# Patient Record
Sex: Female | Born: 1990
Health system: Southern US, Community
[De-identification: ages and names within clinical notes are randomized; demographics above are authoritative.]

## PROBLEM LIST (undated history)

## (undated) ENCOUNTER — Inpatient Hospital Stay (HOSPITAL_COMMUNITY): Payer: Self-pay

## (undated) DIAGNOSIS — Z789 Other specified health status: Secondary | ICD-10-CM

## (undated) HISTORY — PX: NO PAST SURGERIES: SHX2092

---
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
## 2010-07-18 LAB — CBC
HCT: 33.5 % — ABNORMAL LOW (ref 36.0–46.0)
Hemoglobin: 11.3 g/dL — ABNORMAL LOW (ref 12.0–15.0)
Hemoglobin: 9.1 g/dL — ABNORMAL LOW (ref 12.0–15.0)
MCHC: 33.6 g/dL (ref 30.0–36.0)
MCHC: 34.9 g/dL (ref 30.0–36.0)
MCV: 83.9 fL (ref 78.0–100.0)
MCV: 83.9 fL (ref 78.0–100.0)
MCV: 84.6 fL (ref 78.0–100.0)
Platelets: 331 10*3/uL (ref 150–400)
RBC: 3.1 MIL/uL — ABNORMAL LOW (ref 3.87–5.11)
RBC: 3.96 MIL/uL (ref 3.87–5.11)
RDW: 14.2 % (ref 11.5–15.5)
RDW: 14.5 % (ref 11.5–15.5)
RDW: 14.8 % (ref 11.5–15.5)

## 2010-07-18 LAB — COMPREHENSIVE METABOLIC PANEL
AST: 20 U/L (ref 0–37)
Albumin: 2.9 g/dL — ABNORMAL LOW (ref 3.5–5.2)
Alkaline Phosphatase: 147 U/L — ABNORMAL HIGH (ref 39–117)
Calcium: 8.9 mg/dL (ref 8.4–10.5)
Chloride: 107 mEq/L (ref 96–112)
GFR calc Af Amer: >60 mL/min (ref >60)
Potassium: 3.5 mEq/L (ref 3.5–5.1)
Sodium: 136 mEq/L (ref 135–145)

## 2010-07-18 LAB — URINALYSIS, ROUTINE W REFLEX MICROSCOPIC
Bilirubin Urine: NEGATIVE
Hgb urine dipstick: NEGATIVE
Ketones, ur: NEGATIVE mg/dL

## 2010-07-18 LAB — URIC ACID: Uric Acid, Serum: 5 mg/dL (ref 2.4–7.0)

## 2010-07-18 LAB — RPR: RPR Ser Ql: NONREACTIVE

## 2010-08-01 LAB — CBC
HCT: 35.7 % — ABNORMAL LOW (ref 36.0–46.0)
Hemoglobin: 12.3 g/dL (ref 12.0–15.0)
MCHC: 34.3 g/dL (ref 30.0–36.0)
Platelets: 285 10*3/uL (ref 150–400)
RBC: 3.8 MIL/uL — ABNORMAL LOW (ref 3.87–5.11)
RDW: 12.8 % (ref 11.5–15.5)
WBC: 10.9 10*3/uL — ABNORMAL HIGH (ref 4.0–10.5)

## 2010-08-01 LAB — WET PREP, GENITAL: Trich, Wet Prep: NONE SEEN

## 2010-08-01 LAB — GC/CHLAMYDIA PROBE AMP, GENITAL: Chlamydia, DNA Probe: NEGATIVE

## 2010-08-01 LAB — URINALYSIS, ROUTINE W REFLEX MICROSCOPIC
Bilirubin Urine: NEGATIVE
Protein, ur: NEGATIVE mg/dL

## 2010-08-02 LAB — WET PREP, GENITAL
Clue Cells Wet Prep HPF POC: NONE SEEN
Yeast Wet Prep HPF POC: NONE SEEN

## 2011-06-30 IMAGING — US US OB COMP +14 WK
1 series · 14 of 28 positions shown · non-contrast
Comparison: none

OBSTETRICAL ULTRASOUND:
 This ultrasound exam was performed in the [HOSPITAL] Ultrasound Department.  The OB US report was generated in the AS system, and faxed to the ordering physician.  This report is also available in [HOSPITAL]?s AccessANYware and in [REDACTED] PACS.

[Series 1: us ob comp +14 wk · 0.20mm/px · 14 of 38 slices shown]
[im 2/38]
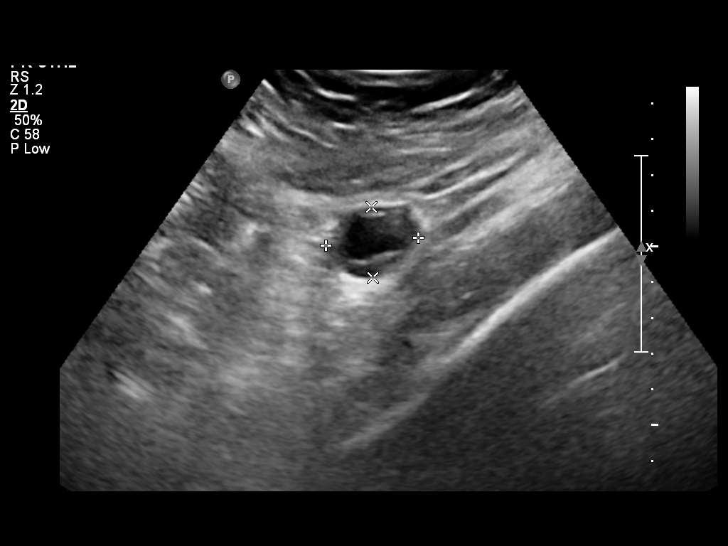
[im 5/38]
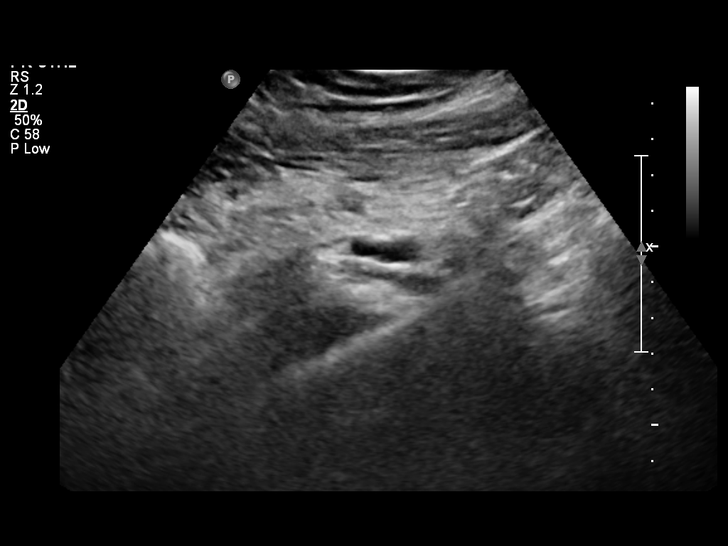
[im 7/38]
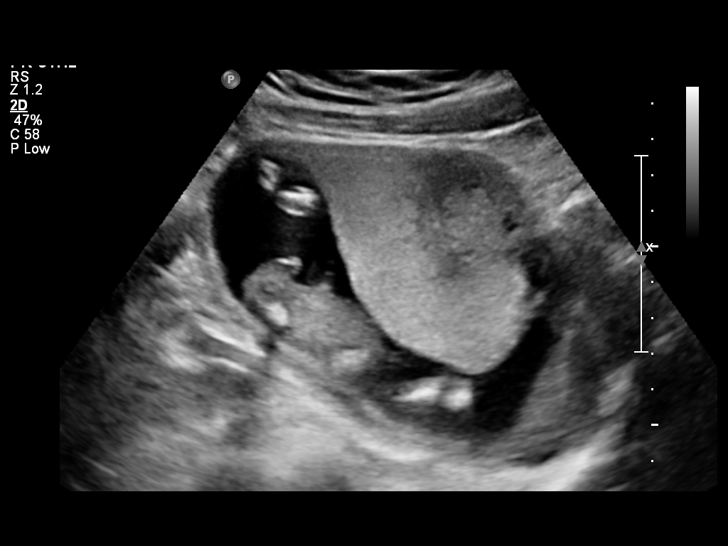
[im 10/38]
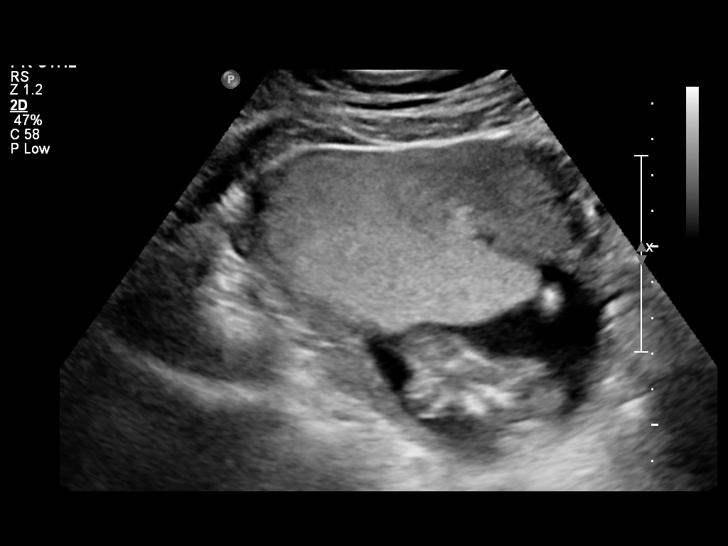
[im 13/38]
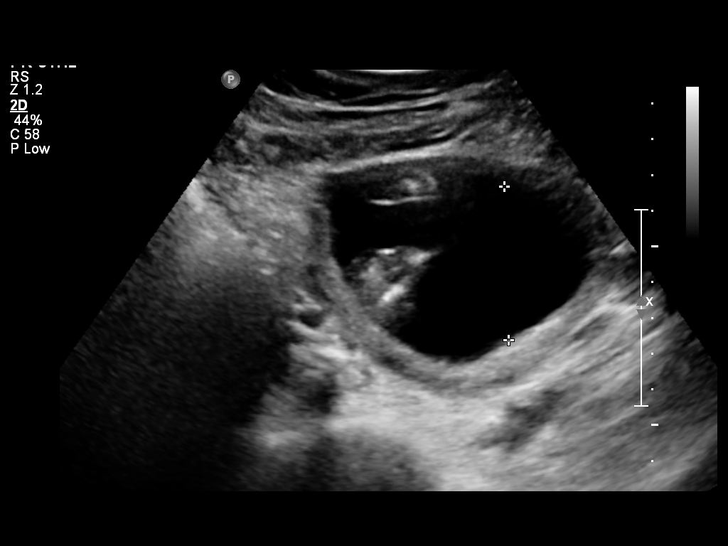
[im 16/38]
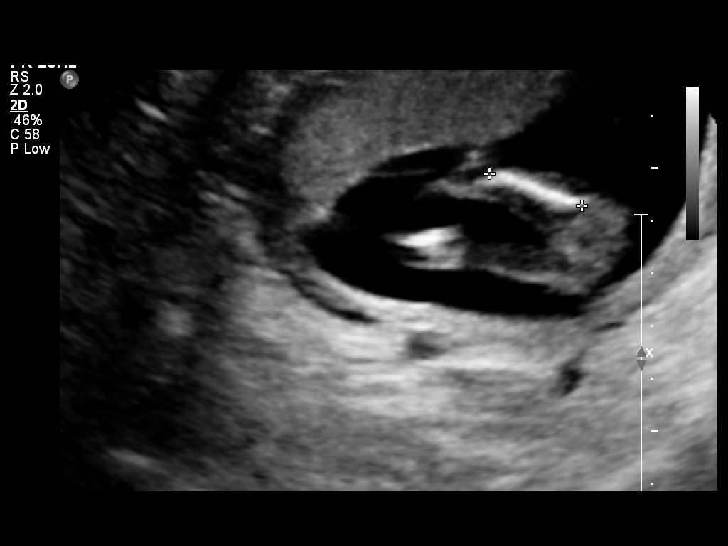
[im 18/38]
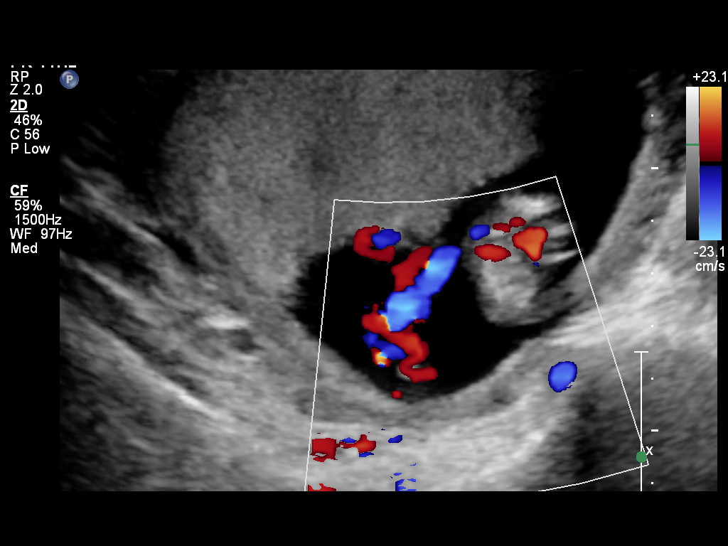
[im 21/38]
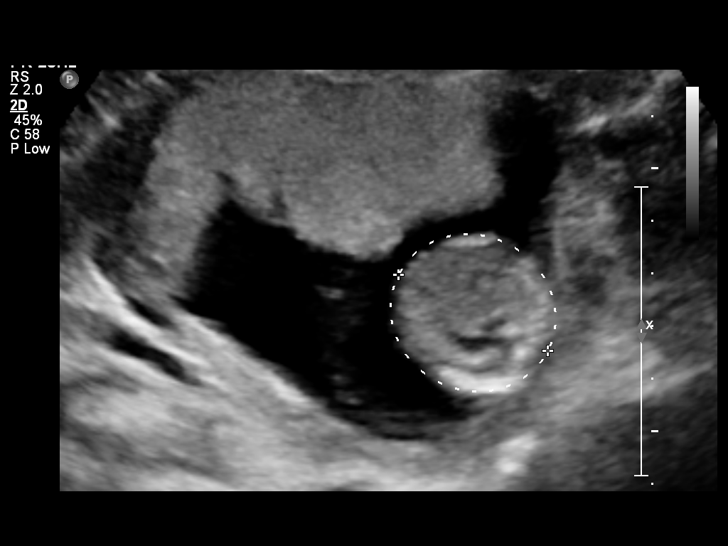
[im 24/38]
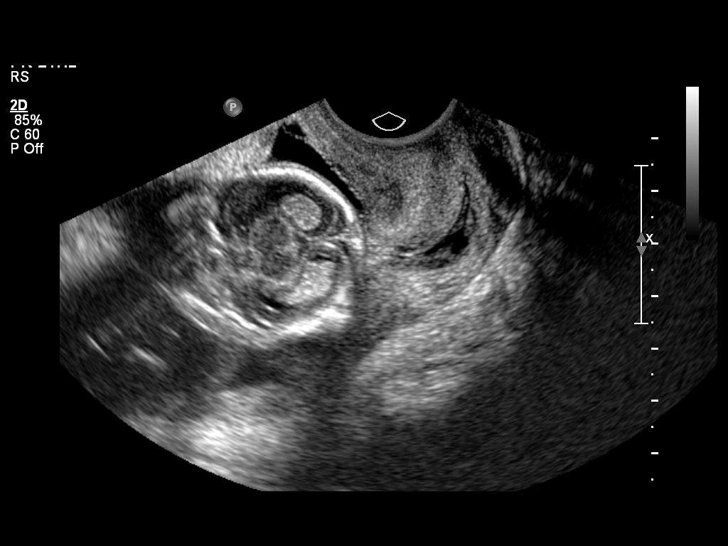
[im 27/38]
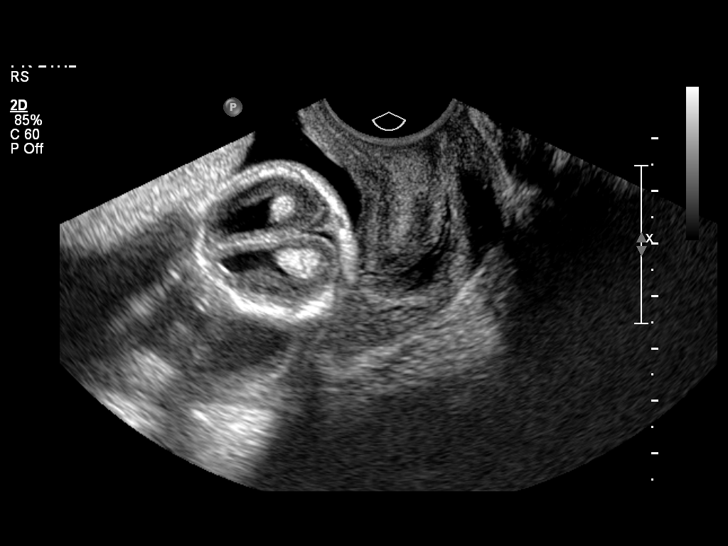
[im 29/38]
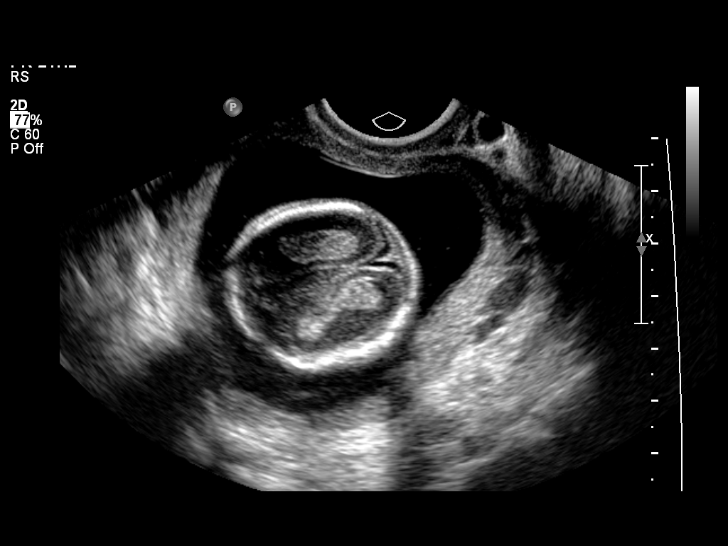
[im 32/38]
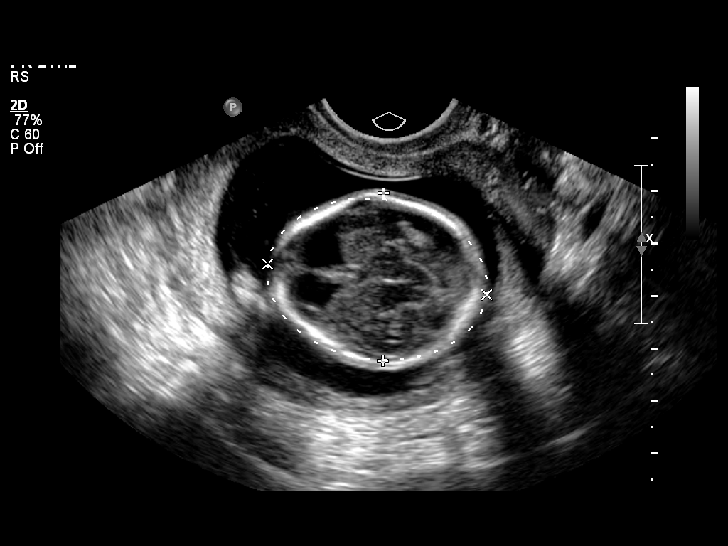
[im 35/38]
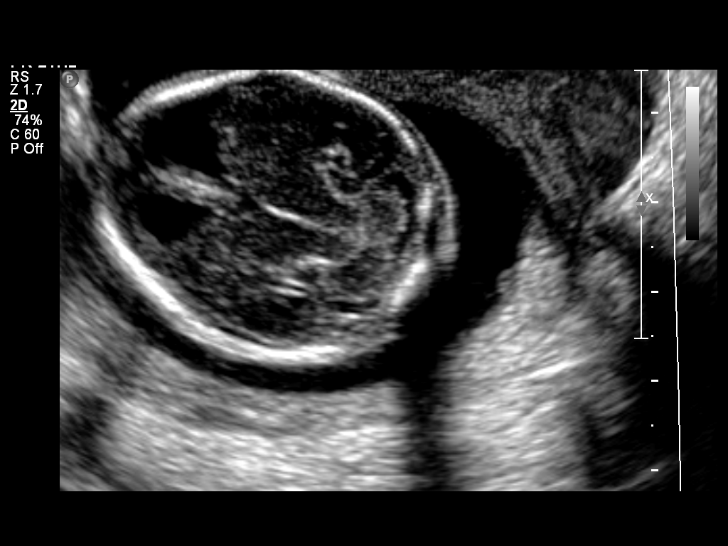
[im 38/38]
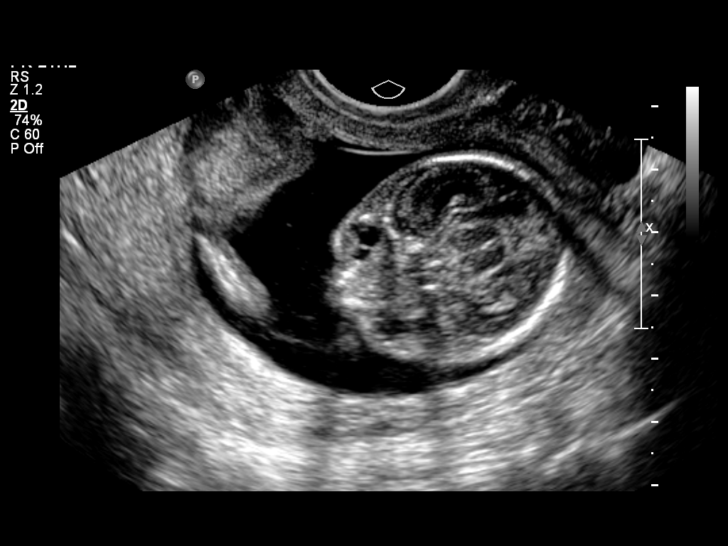

[14 of 28 positions shown; findings below may reference images not displayed]

IMPRESSION: See AS Obstetric US report.

## 2011-07-22 IMAGING — US US OB DETAIL+14 WK
1 series · 14 of 28 positions shown · non-contrast
Comparison: none

OBSTETRICAL ULTRASOUND:
 This ultrasound exam was performed in the [HOSPITAL] Ultrasound Department.  The OB US report was generated in the AS system, and faxed to the ordering physician.  This report is also available in [HOSPITAL]?s Access[employer]ware and in [REDACTED] PACS.

[Series 1: us ob detail +14 wk · 0.19mm/px · 85 acquisitions, 14 frames shown]
[im 4/85]
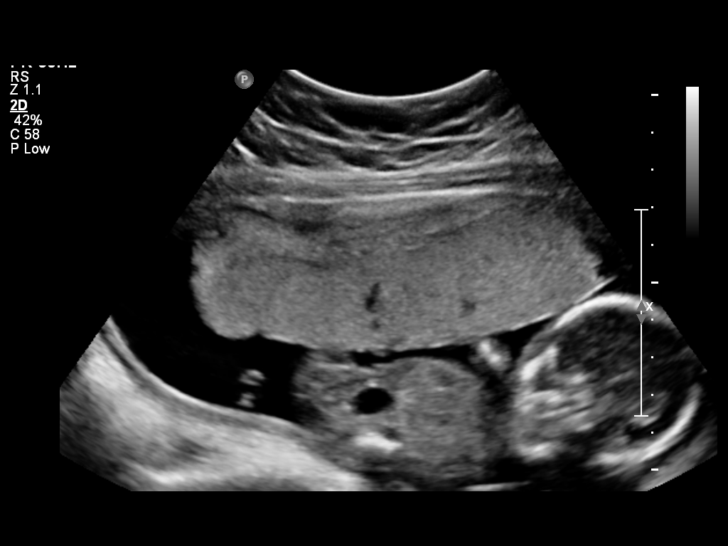
[im 10/85]
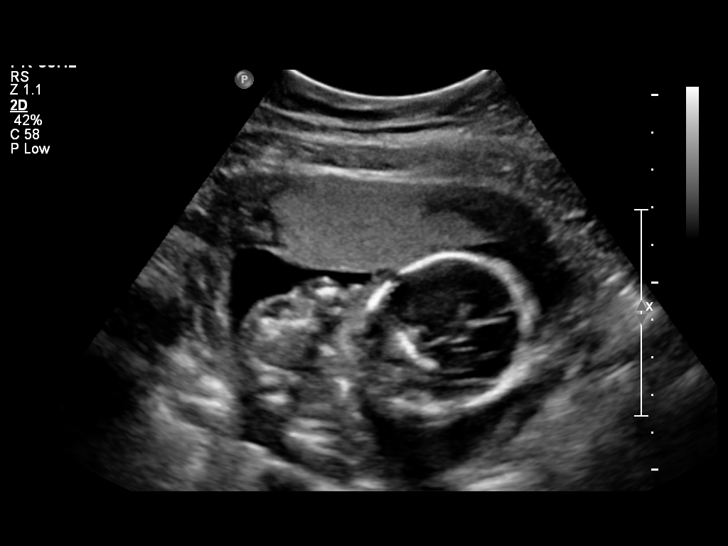
[im 16/85]
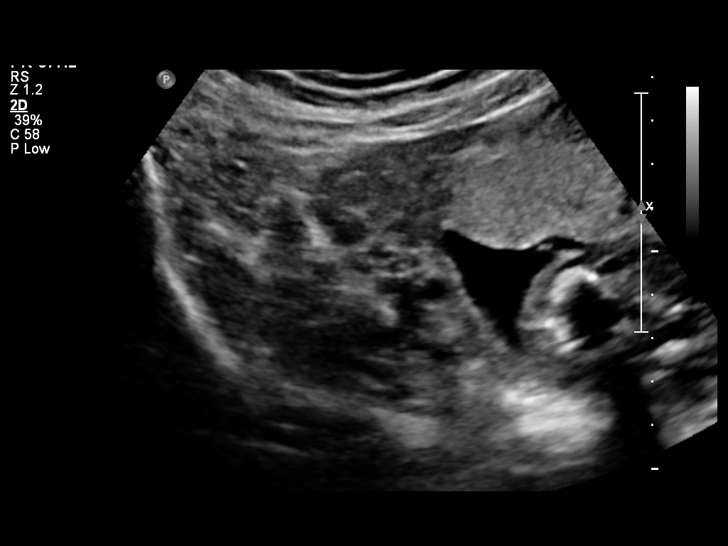
[im 22/85]
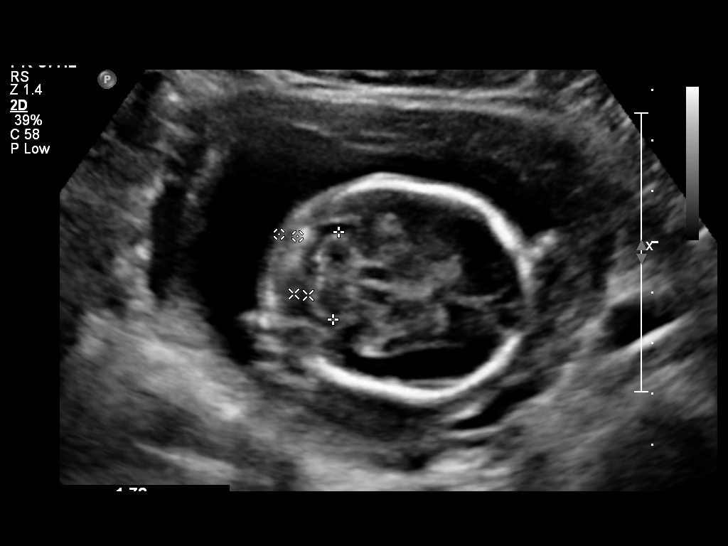
[im 29/85]
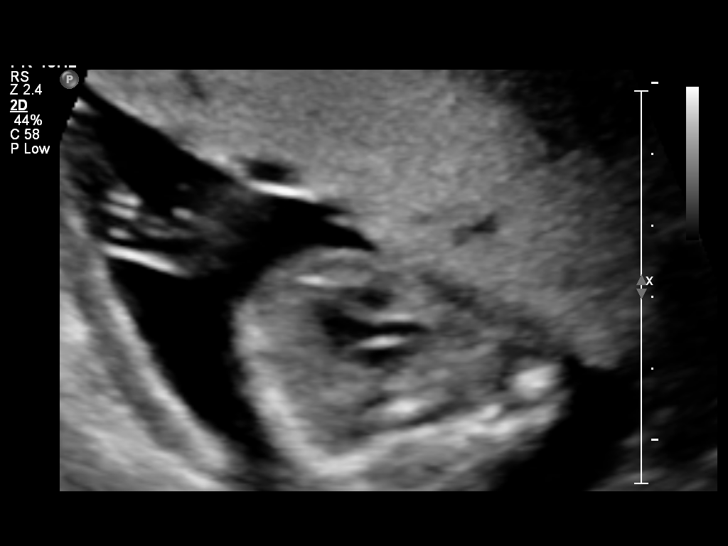
[im 35/85]
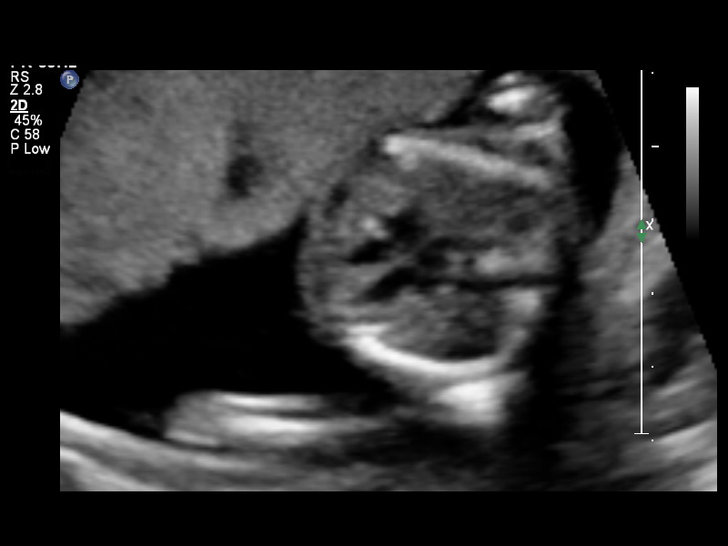
[im 41/85]
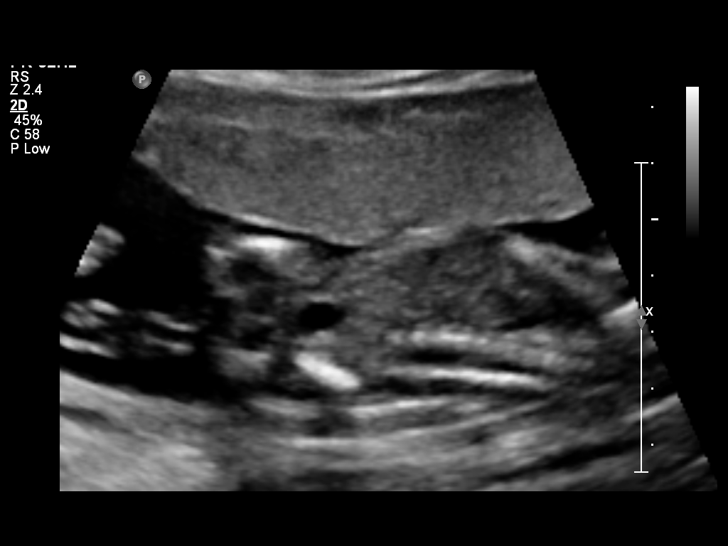
[im 47/85]
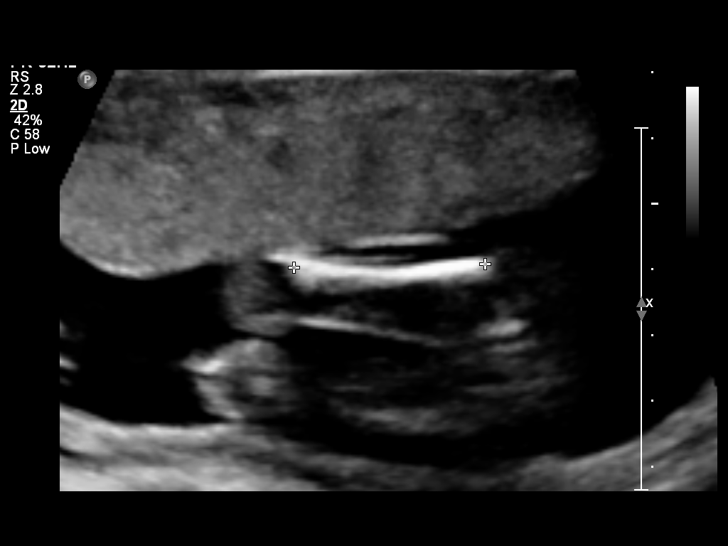
[im 53/85]
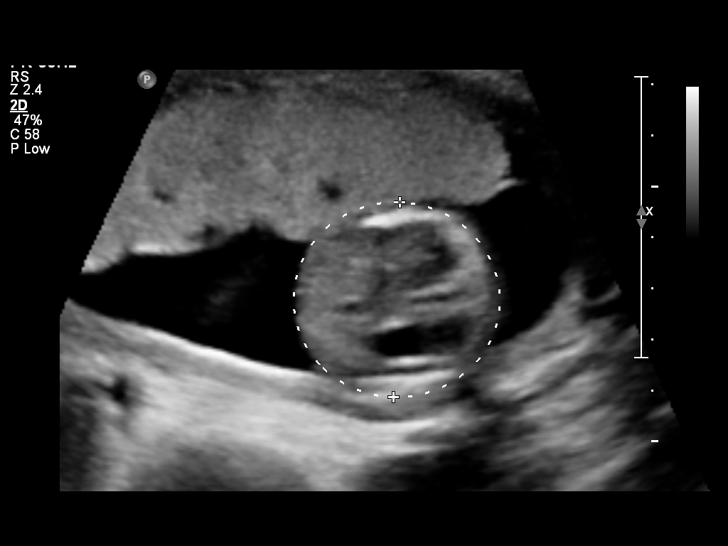
[im 60/85]
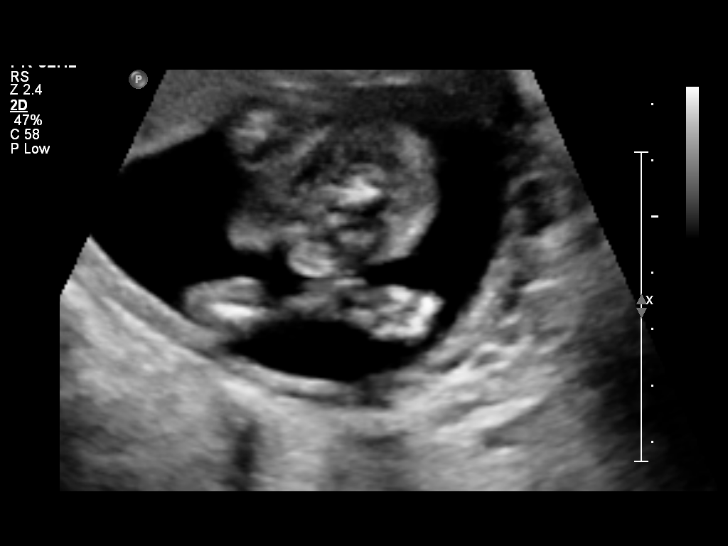
[im 66/85]
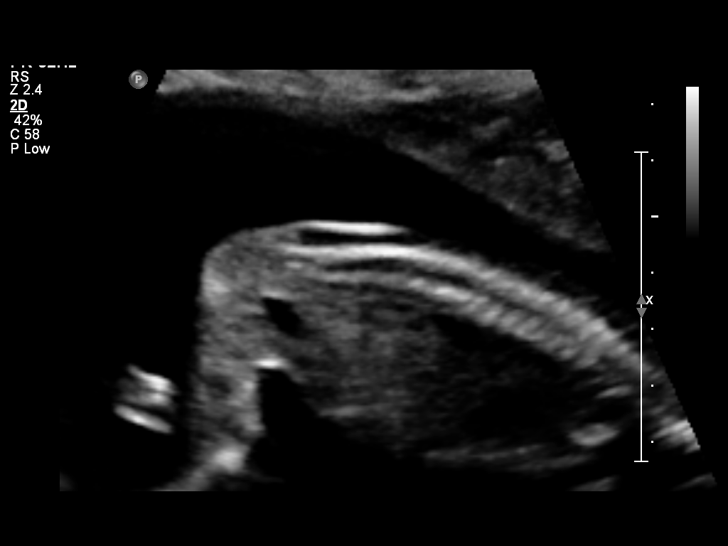
[im 72/85]
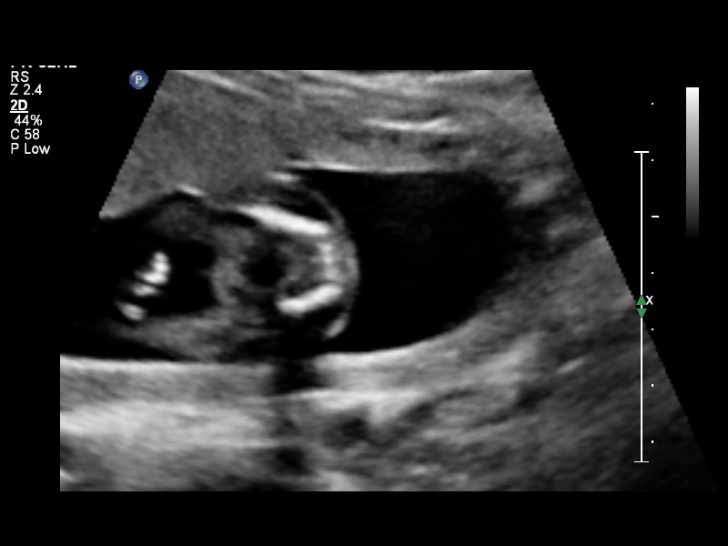
[im 78/85]
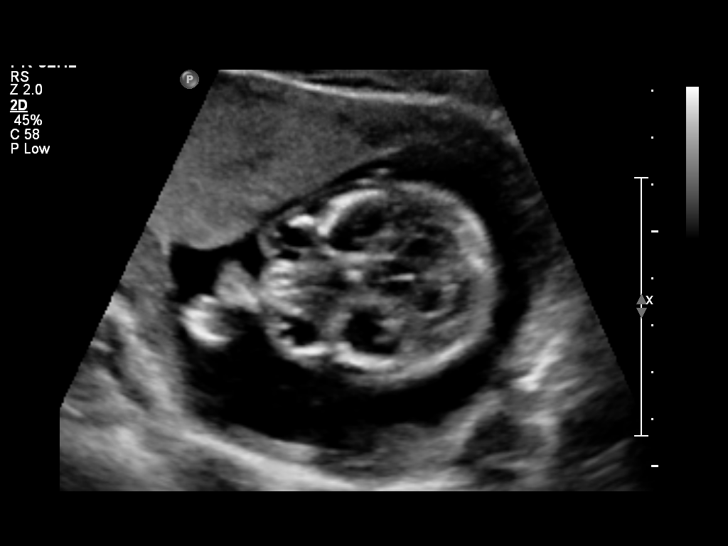
[im 85/85]
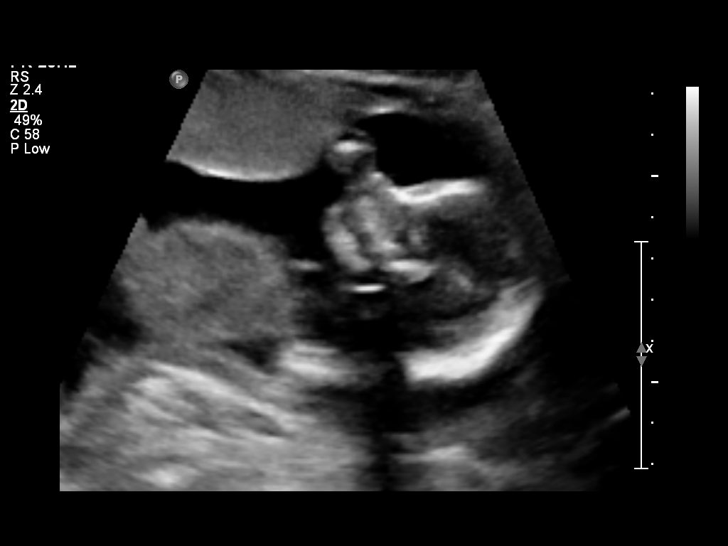

[14 of 28 positions shown; findings below may reference images not displayed]

IMPRESSION: See AS Obstetric US report.

## 2015-05-01 NOTE — L&D Delivery Note (Signed)
25 y.o. G2P1001 at 3284w1d delivered a viable female infant in cephalic, LOA position. No nuchal cord. Right anterior shoulder delivered with ease. 60 sec delayed cord clamping. Cord clamped x2 and cut. Placenta delivered spontaneously intact, with 3VC. Fundus firm on exam with massage and pitocin. Good hemostasis noted.  Laceration: 2nd degree perineal Suture: 3-0 Vicryl Good hemostasis noted.  Mom and baby recovering in LDR.    Apgars:  7/9 Weight:  pending    Jen MowElizabeth Quaid Yeakle, DO OB Fellow [facility] 01/07/2016, 3:16 AM

## 2015-05-01 NOTE — L&D Delivery Note (Signed)
Delivery Note At 2:21 AM a viable and healthy female was delivered via vaginal Delivery (presentation: cephalic LOA ; no nuchal cord  ).  APGAR: 7, 9; weight 7 lb 0.7 oz (3195 g).   Placenta status: intact.  Cord: 3VC with the following complications: 2nd degree perineal laceration.  Cord pH: pending Fundus firm with massage and pitocin  Anesthesia: none Episiotomy: None Lacerations: 2nd degree; Perineal.  Hemostasis noted Suture Repair: 3.0 vicryl Est. Blood Loss (mL): 200  Mom to recovering here in LDR.  Baby to Couplet care / Skin to Skin.  Richard Godine 01/07/2016, 4:34 AM   OB FELLOW MEDICAL STUDENT NOTE ATTESTATION  I have seen and examined this patient. Note this is a Psychologist, occupationalmedical student note and as such does not necessarily reflect the patient's plan of care. Please see delivery note for this date of service.    Jen MowElizabeth Denard Tuminello, DO OB Fellow 01/09/2016, 7:15 PM

## 2015-05-27 ENCOUNTER — Other Ambulatory Visit (INDEPENDENT_AMBULATORY_CARE_PROVIDER_SITE_OTHER): Payer: Self-pay

## 2015-05-27 DIAGNOSIS — Z3481 Encounter for supervision of other normal pregnancy, first trimester: Secondary | ICD-10-CM

## 2015-05-28 LAB — SICKLE CELL SCREEN: Sickle Cell Screen: NEGATIVE

## 2015-05-28 LAB — HIV ANTIBODY (ROUTINE TESTING W REFLEX): HIV 1&2 Ab, 4th Generation: NONREACTIVE

## 2015-05-30 LAB — OBSTETRIC PANEL
Antibody Screen: NEGATIVE
Basophils Absolute: 0 10*3/uL (ref 0.0–0.1)
Basophils Relative: 0 % (ref 0–1)
Eosinophils Absolute: 0.1 10*3/uL (ref 0.0–0.7)
Eosinophils Relative: 1 % (ref 0–5)
HCT: 39.1 % (ref 36.0–46.0)
HEP B S AG: NEGATIVE
Hemoglobin: 13.4 g/dL (ref 12.0–15.0)
LYMPHS ABS: 2.1 10*3/uL (ref 0.7–4.0)
LYMPHS PCT: 26 % (ref 12–46)
MCH: 30.6 pg (ref 26.0–34.0)
MCHC: 34.3 g/dL (ref 30.0–36.0)
MCV: 89.3 fL (ref 78.0–100.0)
MONO ABS: 0.6 10*3/uL (ref 0.1–1.0)
MONOS PCT: 7 % (ref 3–12)
MPV: 9.2 fL (ref 8.6–12.4)
NEUTROS ABS: 5.4 10*3/uL (ref 1.7–7.7)
Neutrophils Relative %: 66 % (ref 43–77)
PLATELETS: 304 10*3/uL (ref 150–400)
RBC: 4.38 MIL/uL (ref 3.87–5.11)
RDW: 13 % (ref 11.5–15.5)
RH TYPE: POSITIVE
Rubella: 27.5 Index — ABNORMAL HIGH (ref <0.90)
WBC: 8.2 10*3/uL (ref 4.0–10.5)

## 2015-06-03 ENCOUNTER — Other Ambulatory Visit (HOSPITAL_COMMUNITY): Admission: RE | Admit: 2015-06-03 | Payer: Self-pay | Source: Ambulatory Visit | Admitting: Family Medicine

## 2015-06-03 ENCOUNTER — Encounter: Payer: Self-pay | Admitting: Internal Medicine

## 2015-06-03 ENCOUNTER — Ambulatory Visit (INDEPENDENT_AMBULATORY_CARE_PROVIDER_SITE_OTHER): Payer: Self-pay | Admitting: Internal Medicine

## 2015-06-03 VITALS — BP 126/80 | HR 69 | Temp 99.0°F | Ht 62.0 in | Wt 179.8 lb

## 2015-06-03 DIAGNOSIS — Z3481 Encounter for supervision of other normal pregnancy, first trimester: Secondary | ICD-10-CM

## 2015-06-03 LAB — POCT WET PREP (WET MOUNT): Clue Cells Wet Prep Whiff POC: NEGATIVE

## 2015-06-03 NOTE — Patient Instructions (Addendum)
Please return in the next 4 weeks for your next prenatal visit. Return on Monday for a lab appointment. A healthy weight gain for you during this pregnancy would be 11-20 lbs total. You should be gaining about 0.5 lb a week starting when you are [redacted] weeks pregnant.    Safe Medications in Pregnancy   Acne: Benzoyl Peroxide Salicylic Acid  Backache/Headache: Tylenol: 2 regular strength every 4 hours OR              2 Extra strength every 6 hours  Colds/Coughs/Allergies: Benadryl (alcohol free) 25 mg every 6 hours as needed Breath right strips Claritin Cepacol throat lozenges Chloraseptic throat spray Cold-Eeze- up to three times per day Cough drops, alcohol free Flonase (by prescription only) Guaifenesin Mucinex Robitussin DM (plain only, alcohol free) Saline nasal spray/drops Sudafed (pseudoephedrine) & Actifed ** use only after [redacted] weeks gestation and if you do not have high blood pressure Tylenol Vicks Vaporub Zinc lozenges Zyrtec   Constipation: Colace Ducolax suppositories Fleet enema Glycerin suppositories Metamucil Milk of magnesia Miralax Senokot Smooth move tea  Diarrhea: Kaopectate Imodium A-D  *NO pepto Bismol  Hemorrhoids: Anusol Anusol HC Preparation H Tucks  Indigestion: Tums Maalox Mylanta Zantac  Pepcid  Insomnia: Benadryl (alcohol free)  every 6 hours as needed Tylenol PM Unisom, no Gelcaps  Leg Cramps: Tums MagGel  Nausea/Vomiting:  Bonine Dramamine Emetrol Ginger extract Sea bands Meclizine  Nausea medication to take during pregnancy:  Unisom (doxylamine succinate 25 mg tablets) Take one tablet daily at bedtime. If symptoms are not adequately controlled, the dose can be increased to a maximum recommended dose of two tablets daily (1/2 tablet in the morning, 1/2 tablet mid-afternoon and one at bedtime). Vitamin B6  tablets. Take one tablet twice a day (up to 200 mg per day).  Skin Rashes: Aveeno  products Benadryl cream or  every 6 hours as needed Calamine Lotion 1% cortisone cream  Yeast infection: Gyne-lotrimin 7 Monistat 7   **If taking multiple medications, please check labels to avoid duplicating the same active ingredients **take medication as directed on the label ** Do not exceed 4000 mg of tylenol in 24 hours **Do not take medications that contain aspirin or ibuprofen

## 2015-06-03 NOTE — Progress Notes (Signed)
Anna Green is a 26 y.o. yo G2P1001 at [redacted]w[redacted]d who presents for her initial prenatal visit. Pregnancy is planned She reports cramping daily for a couple weeks. Feels like menstrual cramps. Denies vaginal bleeding or discharge. . She  is taking PNV. See flow sheet for details.  PMH, POBH, FH, meds, allergies and Social Hx reviewed.  Prenatal Exam: Gen: Well nourished, well developed.  No distress.  Vitals noted. HEENT: Normocephalic, atraumatic.  Neck supple without cervical lymphadenopathy, thyromegaly or thyroid nodules.  Fair dentition. CV: RRR no murmur, gallops or rubs Lungs: CTAB.  Normal respiratory effort without wheezes or rales. Abd: soft, NTND. +BS.  Uterus not appreciated above pelvis. GU: Normal external female genitalia without lesions.  Normal vaginal, well rugated without lesions. No vaginal discharge.  Bimanual exam: No adnexal mass or TTP. No CMT.  Uterus size consistent with dates. Ext: No clubbing, cyanosis or edema. Psych: Normal grooming and dress.  Not depressed or anxious appearing.  Normal thought content and process without flight of ideas or looseness of associations.  Assessment & Plan: 1) 25 y.o. yo G2P1001 at [redacted]w[redacted]d via LMP doing well.  Current pregnancy issues include cramping. Will order serum bHCG today and repeat on Monday to ensure levels not multiplying in a fashion consistent with an ectopic pregnancy.  Dating is reliable. Prenatal labs reviewed, unremarkable. Will obtain OB urine culture as this was not performed with initial OB labs.  Genetic screening discussed. Patient to consider for future visits.  Early glucola is indicated given patient's Hispanic ethnicity and BMI.  PHQ-9 and Pregnancy Medical Home forms completed and reviewed. Pap smear, GC/chlamydia, and wet prep performed today.   Bleeding and pain precautions reviewed. Importance of prenatal vitamins reviewed.  Follow up in 4 weeks.

## 2015-06-04 LAB — CULTURE, OB URINE

## 2015-06-04 LAB — HCG, QUANTITATIVE, PREGNANCY: HCG, BETA CHAIN, QUANT, S: 66631.2 m[IU]/mL — AB

## 2015-06-06 ENCOUNTER — Other Ambulatory Visit: Payer: Self-pay

## 2015-06-06 LAB — CERVICOVAGINAL ANCILLARY ONLY
Chlamydia: NEGATIVE
Neisseria Gonorrhea: NEGATIVE

## 2015-06-06 LAB — CYTOLOGY - PAP

## 2015-06-08 ENCOUNTER — Encounter: Payer: Self-pay | Admitting: Internal Medicine

## 2015-06-30 ENCOUNTER — Ambulatory Visit (INDEPENDENT_AMBULATORY_CARE_PROVIDER_SITE_OTHER): Payer: Self-pay | Admitting: Internal Medicine

## 2015-06-30 VITALS — BP 129/76 | HR 83 | Temp 98.7°F | Wt 184.0 lb

## 2015-06-30 DIAGNOSIS — Z331 Pregnant state, incidental: Secondary | ICD-10-CM

## 2015-06-30 DIAGNOSIS — Z3491 Encounter for supervision of normal pregnancy, unspecified, first trimester: Secondary | ICD-10-CM

## 2015-06-30 NOTE — Patient Instructions (Signed)
Make a lab appointment before 2 weeks for your glucose tolerance test (testing for diabetes). Make an appointment with Dr. Earlene Plater for your next prenatal visit in 4 weeks.

## 2015-06-30 NOTE — Progress Notes (Signed)
Anna Green is a 25 y.o. G2P1001 at [redacted]w[redacted]d here for routine follow up.  She reports no complaints. See flow sheet for details.  A/P: Pregnancy at [redacted]w[redacted]d.  Doing well.   Pregnancy issues include: no concerns. Taking PNV daily.  Early GTT is indicated in this patient. She is unable to stay for testing today. Future lab order placed and patient to return in the next 2 weeks for testing.  Patient is not sure if she is  interested in genetic screening. Still have time to decide prior to time of QUAD screen.  Bleeding and pain precautions reviewed. Follow up 4 weeks.

## 2015-07-06 ENCOUNTER — Other Ambulatory Visit: Payer: Self-pay

## 2015-07-08 ENCOUNTER — Other Ambulatory Visit (INDEPENDENT_AMBULATORY_CARE_PROVIDER_SITE_OTHER): Payer: Self-pay

## 2015-07-08 DIAGNOSIS — Z3491 Encounter for supervision of normal pregnancy, unspecified, first trimester: Secondary | ICD-10-CM

## 2015-07-08 DIAGNOSIS — Z331 Pregnant state, incidental: Secondary | ICD-10-CM

## 2015-07-08 LAB — GLUCOSE, CAPILLARY
Comment 1: 1
GLUCOSE-CAPILLARY: 123 mg/dL — AB (ref 65–99)

## 2015-07-13 ENCOUNTER — Other Ambulatory Visit: Payer: Self-pay

## 2015-08-04 ENCOUNTER — Ambulatory Visit (INDEPENDENT_AMBULATORY_CARE_PROVIDER_SITE_OTHER): Payer: Self-pay | Admitting: Internal Medicine

## 2015-08-04 ENCOUNTER — Encounter: Payer: Self-pay | Admitting: Internal Medicine

## 2015-08-04 VITALS — BP 134/85 | HR 88 | Temp 98.2°F | Wt 185.0 lb

## 2015-08-04 DIAGNOSIS — Z3490 Encounter for supervision of normal pregnancy, unspecified, unspecified trimester: Secondary | ICD-10-CM

## 2015-08-04 DIAGNOSIS — Z331 Pregnant state, incidental: Secondary | ICD-10-CM

## 2015-08-04 LAB — AFP, QUAD SCREEN
AFP MOM: 0.85
DIA MOM: 0.68
HCG MOM: 0.55
UE3 MOM: 1.37

## 2015-08-04 NOTE — Patient Instructions (Signed)
Please go to your scheduled ultrasound. I will let you know the results of the genetic screening.   Return in 4 weeks.   Keep taking your prenatal vitamins!

## 2015-08-04 NOTE — Progress Notes (Signed)
Recheck BP 110/72. Deseree Bruna PotterBlount, CMA

## 2015-08-04 NOTE — Progress Notes (Signed)
Anna Green is a 25 y.o. G2P1001 at 3210w6d here for routine follow up.  She reports no complaints. See flow sheet for details. Repeat BP 110/72.   A/P: Pregnancy at 8210w6d.  Doing well.   Pregnancy issues include None. Anatomy ultrasound ordered to be scheduled at 18-19 weeks. Patient is interested in genetic screening. AFP Quad screen ordered today.  Bleeding and pain precautions reviewed. Will continue to monitor BP closely.  Follow up 4 weeks.

## 2015-08-17 ENCOUNTER — Telehealth: Payer: Self-pay | Admitting: *Deleted

## 2015-08-17 NOTE — Telephone Encounter (Signed)
-----   Message from Arvilla Marketatherine Lauren Wallace, DO sent at 08/17/2015  3:29 PM EDT ----- Please call patient and let her know that genetic screening was essentially negative. Her baby has a 1 in 10,000 chance of having Down Syndrome or Trisomy 18 defect based on her AFP results.   Marcy Sirenatherine Wallace, D.O. 08/17/2015, 3:29 PM PGY-1, [facility]

## 2015-08-17 NOTE — Telephone Encounter (Signed)
Pt informed. Zimmerman Rumple, April D, CMA  

## 2015-08-17 NOTE — Telephone Encounter (Signed)
LVM to call office back to inform of below. Zimmerman Rumple, April D, CMA  

## 2015-09-02 ENCOUNTER — Ambulatory Visit (INDEPENDENT_AMBULATORY_CARE_PROVIDER_SITE_OTHER): Payer: Self-pay | Admitting: Internal Medicine

## 2015-09-02 ENCOUNTER — Encounter: Payer: Self-pay | Admitting: Internal Medicine

## 2015-09-02 VITALS — BP 110/80 | HR 98 | Wt 188.0 lb

## 2015-09-02 DIAGNOSIS — Z3490 Encounter for supervision of normal pregnancy, unspecified, unspecified trimester: Secondary | ICD-10-CM

## 2015-09-02 DIAGNOSIS — Z331 Pregnant state, incidental: Secondary | ICD-10-CM

## 2015-09-02 NOTE — Patient Instructions (Signed)
Please follow up in 4 weeks with Dr. Earl[facility]aterWallace [facility].

## 2015-09-02 NOTE — Progress Notes (Signed)
Anna BanningLizbeth Lavell LusterRivera Green is a 25 y.o. G2P1001 at 4039w0d here for routine follow up.  She reports no concerns. Not taking PNV consistently.   See flow sheet for details.  A/P: Pregnancy at 3439w0d.  Doing well.   Pregnancy issues include None.  Anatomy scan reviewed, problems are not noted. Dating reviewed and will keep dating by LMP as US was within 7 days of GA by LMP.  Discussed setting phone alarm for PNV or placing it in area that it will be seen multiple times per day.  Preterm labor precautions reviewed. Follow up 4 weeks.

## 2015-10-06 ENCOUNTER — Encounter: Payer: Self-pay | Admitting: Family Medicine

## 2015-10-06 ENCOUNTER — Ambulatory Visit (INDEPENDENT_AMBULATORY_CARE_PROVIDER_SITE_OTHER): Payer: Self-pay | Admitting: Family Medicine

## 2015-10-06 VITALS — BP 108/70 | HR 82 | Temp 98.2°F | Wt 189.0 lb

## 2015-10-06 DIAGNOSIS — Z3482 Encounter for supervision of other normal pregnancy, second trimester: Secondary | ICD-10-CM

## 2015-10-06 DIAGNOSIS — O09893 Supervision of other high risk pregnancies, third trimester: Secondary | ICD-10-CM | POA: Insufficient documentation

## 2015-10-06 NOTE — Patient Instructions (Signed)
If you have any cramping/contractions, vaginal bleeding, fluid leaking, or are worried that baby is not moving normally, go immediately to [facility] to be evaluated.   Follow up in 3 weeks Schedule appointment for lab visit for glucose test (diabetes)  Second Trimester of Pregnancy The second trimester is from week 13 through week 28, months 4 through 6. The second trimester is often a time when you feel your best. Your body has also adjusted to being pregnant, and you begin to feel better physically. Usually, morning sickness has lessened or quit completely, you may have more energy, and you may have an increase in appetite. The second trimester is also a time when the fetus is growing rapidly. At the end of the sixth month, the fetus is about 9 inches long and weighs about 1 pounds. You will likely begin to feel the baby move (quickening) between 18 and 20 weeks of the pregnancy. BODY CHANGES Your body goes through many changes during pregnancy. The changes vary from woman to woman.   Your weight will continue to increase. You will notice your lower abdomen bulging out.  You may begin to get stretch marks on your hips, abdomen, and breasts.  You may develop headaches that can be relieved by medicines approved by your health care provider.  You may urinate more often because the fetus is pressing on your bladder.  You may develop or continue to have heartburn as a result of your pregnancy.  You may develop constipation because certain hormones are causing the muscles that push waste through your intestines to slow down.  You may develop hemorrhoids or swollen, bulging veins (varicose veins).  You may have back pain because of the weight gain and pregnancy hormones relaxing your joints between the bones in your pelvis and as a result of a shift in weight and the muscles that support your balance.  Your breasts will continue to grow and be tender.  Your gums may bleed and may be  sensitive to brushing and flossing.  Dark spots or blotches (chloasma, mask of pregnancy) may develop on your face. This will likely fade after the baby is born.  A dark line from your belly button to the pubic area (linea nigra) may appear. This will likely fade after the baby is born.  You may have changes in your hair. These can include thickening of your hair, rapid growth, and changes in texture. Some women also have hair loss during or after pregnancy, or hair that feels dry or thin. Your hair will most likely return to normal after your baby is born. WHAT TO EXPECT AT YOUR PRENATAL VISITS During a routine prenatal visit:  You will be weighed to make sure you and the fetus are growing normally.  Your blood pressure will be taken.  Your abdomen will be measured to track your baby's growth.  The fetal heartbeat will be listened to.  Any test results from the previous visit will be discussed. Your health care provider may ask you:  How you are feeling.  If you are feeling the baby move.  If you have had any abnormal symptoms, such as leaking fluid, bleeding, severe headaches, or abdominal cramping.  If you are using any tobacco products, including cigarettes, chewing tobacco, and electronic cigarettes.  If you have any questions. Other tests that may be performed during your second trimester include:  Blood tests that check for:  Low iron levels (anemia).  Gestational diabetes (between 24 and 28 weeks).  Rh antibodies.  Urine tests to check for infections, diabetes, or protein in the urine.  An ultrasound to confirm the proper growth and development of the baby.  An amniocentesis to check for possible genetic problems.  Fetal screens for spina bifida and Down syndrome.  HIV (human immunodeficiency virus) testing. Routine prenatal testing includes screening for HIV, unless you choose not to have this test. HOME CARE INSTRUCTIONS   Avoid all smoking, herbs,  alcohol, and unprescribed drugs. These chemicals affect the formation and growth of the baby.  Do not use any tobacco products, including cigarettes, chewing tobacco, and electronic cigarettes. If you need help quitting, ask your health care provider. You may receive counseling support and other resources to help you quit.  Follow your health care provider's instructions regarding medicine use. There are medicines that are either safe or unsafe to take during pregnancy.  Exercise only as directed by your health care provider. Experiencing uterine cramps is a good sign to stop exercising.  Continue to eat regular, healthy meals.  Wear a good support bra for breast tenderness.  Do not use hot tubs, steam rooms, or saunas.  Wear your seat belt at all times when driving.  Avoid raw meat, uncooked cheese, cat litter boxes, and soil used by cats. These carry germs that can cause birth defects in the baby.  Take your prenatal vitamins.  Take 1500-2000 mg of calcium daily starting at the 20th week of pregnancy until you deliver your baby.  Try taking a stool softener (if your health care provider approves) if you develop constipation. Eat more high-fiber foods, such as fresh vegetables or fruit and whole grains. Drink plenty of fluids to keep your urine clear or pale yellow.  Take warm sitz baths to soothe any pain or discomfort caused by hemorrhoids. Use hemorrhoid cream if your health care provider approves.  If you develop varicose veins, wear support hose. Elevate your feet for 15 minutes, 3-4 times a day. Limit salt in your diet.  Avoid heavy lifting, wear low heel shoes, and practice good posture.  Rest with your legs elevated if you have leg cramps or low back pain.  Visit your dentist if you have not gone yet during your pregnancy. Use a soft toothbrush to brush your teeth and be gentle when you floss.  A sexual relationship may be continued unless your health care provider directs  you otherwise.  Continue to go to all your prenatal visits as directed by your health care provider. SEEK MEDICAL CARE IF:   You have dizziness.  You have mild pelvic cramps, pelvic pressure, or nagging pain in the abdominal area.  You have persistent nausea, vomiting, or diarrhea.  You have a bad smelling vaginal discharge.  You have pain with urination. SEEK IMMEDIATE MEDICAL CARE IF:   You have a fever.  You are leaking fluid from your vagina.  You have spotting or bleeding from your vagina.  You have severe abdominal cramping or pain.  You have rapid weight gain or loss.  You have shortness of breath with chest pain.  You notice sudden or extreme swelling of your face, hands, ankles, feet, or legs.  You have not felt your baby move in over an hour.  You have severe headaches that do not go away with medicine.  You have vision changes.   This information is not intended to replace advice given to you by your health care provider. Make sure you discuss any questions you have with your  health care provider.   Document Released: 04/10/2001 Document Revised: 05/07/2014 Document Reviewed: 06/17/2012 Elsevier Interactive Patient Education [employer]

## 2015-10-06 NOTE — Progress Notes (Signed)
Anna BanningLizbeth Lavell LusterRivera Green is a 25 y.o. G2P1001 at 3336w6d here for routine follow up.  She reports inconsistent use of PNV. No contractions, vaginal bleeding or discharge, leak of fluids. Positive fetal movement. No concerns today. See flow sheet for details.  A/P: Pregnancy at 5836w6d.  Doing well.   Pregnancy issues include inconsistent use of PNV. Discussed techniques for remembering to take medication, including setting phone reminders and placing pill bottle in easily visible location.  Preterm labor and fetal movement precautions reviewed. Follow up 3 weeks.  Tarri AbernethyAbigail J Amanii Snethen, MD PGY-1 [facility] Pager [phone]

## 2015-10-26 ENCOUNTER — Other Ambulatory Visit: Payer: Self-pay

## 2015-10-27 ENCOUNTER — Encounter: Payer: Self-pay | Admitting: Family Medicine

## 2015-10-28 ENCOUNTER — Other Ambulatory Visit: Payer: Self-pay

## 2015-10-31 ENCOUNTER — Encounter: Payer: Self-pay | Admitting: Family Medicine

## 2015-11-16 ENCOUNTER — Ambulatory Visit (INDEPENDENT_AMBULATORY_CARE_PROVIDER_SITE_OTHER): Payer: Medicaid Other | Admitting: Internal Medicine

## 2015-11-16 VITALS — BP 124/75 | HR 92 | Temp 98.5°F | Wt 192.6 lb

## 2015-11-16 DIAGNOSIS — Z3493 Encounter for supervision of normal pregnancy, unspecified, third trimester: Secondary | ICD-10-CM

## 2015-11-16 NOTE — Patient Instructions (Signed)
It was so nice to meet you!  Everything is going well. Please come back tomorrow or Monday to have your labs done.  We would like to see you back in 2 weeks. Please schedule an appointment for OB clinic.  -Dr. Nancy Marus  Third Trimester of Pregnancy The third trimester is from week 29 through week 42, months 7 through 9. The third trimester is a time when the fetus is growing rapidly. At the end of the ninth month, the fetus is about 20 inches in length and weighs 6-10 pounds.  BODY CHANGES Your body goes through many changes during pregnancy. The changes vary from woman to woman.   Your weight will continue to increase. You can expect to gain 25-35 pounds (11-16 kg) by the end of the pregnancy.  You may begin to get stretch marks on your hips, abdomen, and breasts.  You may urinate more often because the fetus is moving lower into your pelvis and pressing on your bladder.  You may develop or continue to have heartburn as a result of your pregnancy.  You may develop constipation because certain hormones are causing the muscles that push waste through your intestines to slow down.  You may develop hemorrhoids or swollen, bulging veins (varicose veins).  You may have pelvic pain because of the weight gain and pregnancy hormones relaxing your joints between the bones in your pelvis. Backaches may result from overexertion of the muscles supporting your posture.  You may have changes in your hair. These can include thickening of your hair, rapid growth, and changes in texture. Some women also have hair loss during or after pregnancy, or hair that feels dry or thin. Your hair will most likely return to normal after your baby is born.  Your breasts will continue to grow and be tender. A yellow discharge may leak from your breasts called colostrum.  Your belly button may stick out.  You may feel short of breath because of your expanding uterus.  You may notice the fetus "dropping," or moving  lower in your abdomen.  You may have a bloody mucus discharge. This usually occurs a few days to a week before labor begins.  Your cervix becomes thin and soft (effaced) near your due date. WHAT TO EXPECT AT YOUR PRENATAL EXAMS  You will have prenatal exams every 2 weeks until week 36. Then, you will have weekly prenatal exams. During a routine prenatal visit:  You will be weighed to make sure you and the fetus are growing normally.  Your blood pressure is taken.  Your abdomen will be measured to track your baby's growth.  The fetal heartbeat will be listened to.  Any test results from the previous visit will be discussed.  You may have a cervical check near your due date to see if you have effaced. At around 36 weeks, your caregiver will check your cervix. At the same time, your caregiver will also perform a test on the secretions of the vaginal tissue. This test is to determine if a type of bacteria, Group B streptococcus, is present. Your caregiver will explain this further. Your caregiver may ask you:  What your birth plan is.  How you are feeling.  If you are feeling the baby move.  If you have had any abnormal symptoms, such as leaking fluid, bleeding, severe headaches, or abdominal cramping.  If you are using any tobacco products, including cigarettes, chewing tobacco, and electronic cigarettes.  If you have any questions. Other tests or  screenings that may be performed during your third trimester include:  Blood tests that check for low iron levels (anemia).  Fetal testing to check the health, activity level, and growth of the fetus. Testing is done if you have certain medical conditions or if there are problems during the pregnancy.  HIV (human immunodeficiency virus) testing. If you are at high risk, you may be screened for HIV during your third trimester of pregnancy. FALSE LABOR You may feel small, irregular contractions that eventually go away. These are called  Braxton Hicks contractions, or false labor. Contractions may last for hours, days, or even weeks before true labor sets in. If contractions come at regular intervals, intensify, or become painful, it is best to be seen by your caregiver.  SIGNS OF LABOR   Menstrual-like cramps.  Contractions that are 5 minutes apart or less.  Contractions that start on the top of the uterus and spread down to the lower abdomen and back.  A sense of increased pelvic pressure or back pain.  A watery or bloody mucus discharge that comes from the vagina. If you have any of these signs before the 37th week of pregnancy, call your caregiver right away. You need to go to the hospital to get checked immediately. HOME CARE INSTRUCTIONS   Avoid all smoking, herbs, alcohol, and unprescribed drugs. These chemicals affect the formation and growth of the baby.  Do not use any tobacco products, including cigarettes, chewing tobacco, and electronic cigarettes. If you need help quitting, ask your health care provider. You may receive counseling support and other resources to help you quit.  Follow your caregiver's instructions regarding medicine use. There are medicines that are either safe or unsafe to take during pregnancy.  Exercise only as directed by your caregiver. Experiencing uterine cramps is a good sign to stop exercising.  Continue to eat regular, healthy meals.  Wear a good support bra for breast tenderness.  Do not use hot tubs, steam rooms, or saunas.  Wear your seat belt at all times when driving.  Avoid raw meat, uncooked cheese, cat litter boxes, and soil used by cats. These carry germs that can cause birth defects in the baby.  Take your prenatal vitamins.  Take 1500-2000 mg of calcium daily starting at the 20th week of pregnancy until you deliver your baby.  Try taking a stool softener (if your caregiver approves) if you develop constipation. Eat more high-fiber foods, such as fresh vegetables  or fruit and whole grains. Drink plenty of fluids to keep your urine clear or pale yellow.  Take warm sitz baths to soothe any pain or discomfort caused by hemorrhoids. Use hemorrhoid cream if your caregiver approves.  If you develop varicose veins, wear support hose. Elevate your feet for 15 minutes, 3-4 times a day. Limit salt in your diet.  Avoid heavy lifting, wear low heal shoes, and practice good posture.  Rest a lot with your legs elevated if you have leg cramps or low back pain.  Visit your dentist if you have not gone during your pregnancy. Use a soft toothbrush to brush your teeth and be gentle when you floss.  A sexual relationship may be continued unless your caregiver directs you otherwise.  Do not travel far distances unless it is absolutely necessary and only with the approval of your caregiver.  Take prenatal classes to understand, practice, and ask questions about the labor and delivery.  Make a trial run to the hospital.  Pack your hospital bag.  Prepare the baby's nursery.  Continue to go to all your prenatal visits as directed by your caregiver. SEEK MEDICAL CARE IF:  You are unsure if you are in labor or if your water has broken.  You have dizziness.  You have mild pelvic cramps, pelvic pressure, or nagging pain in your abdominal area.  You have persistent nausea, vomiting, or diarrhea.  You have a bad smelling vaginal discharge.  You have pain with urination. SEEK IMMEDIATE MEDICAL CARE IF:   You have a fever.  You are leaking fluid from your vagina.  You have spotting or bleeding from your vagina.  You have severe abdominal cramping or pain.  You have rapid weight loss or gain.  You have shortness of breath with chest pain.  You notice sudden or extreme swelling of your face, hands, ankles, feet, or legs.  You have not felt your baby move in over an hour.  You have severe headaches that do not go away with medicine.  You have vision  changes.   This information is not intended to replace advice given to you by your health care provider. Make sure you discuss any questions you have with your health care provider.   Document Released: 04/10/2001 Document Revised: 05/07/2014 Document Reviewed: 06/17/2012 Elsevier Interactive Patient Education [employer]

## 2015-11-16 NOTE — Progress Notes (Signed)
Roanna BanningLizbeth Lavell LusterRivera Salazar is a 25 y.o. G2P1001 at 9673w6d here for routine follow up. She states she feels well overall. She denies contractions, vaginal bleeding, or leakage of fluids. Positive fetal movement. No concerns today. See flow sheet for details.  A/P: Pregnancy at 5865w5d. Doing well, no concerns today except for some family stress that she didn't want to discuss at today's visit. Normal exam. -Pt overdue for 1 hour GTT, HIV, RPR, and CBC. Future orders placed. Pt will come back to clinic to have these done. -PHQ-9 performed today and was a 6. Pt endorses family stressors. -Pregnancy medical home form completed -Preterm labor and fetal movement precautions reviewed.  -Follow-up in OB clinic in 2 weeks.  Willadean CarolKaty Jood Retana, MD PGY-2

## 2015-11-28 ENCOUNTER — Telehealth: Payer: Self-pay | Admitting: Internal Medicine

## 2015-11-28 NOTE — Telephone Encounter (Signed)
Called Pt to make sure that she comes to have her 2nd trimester labs done. Pt states she was on vacation and then her father died, so she has not had a chance to come in to have these done. She will call our lab this week to come have these done.  I have also tried to call Dr. Elsie Stain office regarding her anatomy US. Dr. Elsie Stain office said they have not seen her since her previous pregnancy in 2012 and do not have any recent ultrasounds for her. On chart review, Pt's OB provider reviewed her [place] on 5/5 and noted that it was normal. Will continue to try to get a copy of her [place] for our records. If we cannot obtain this report, may need to send her to have another US performed.  Willadean Carol, MD PGY-2

## 2015-11-29 ENCOUNTER — Other Ambulatory Visit (INDEPENDENT_AMBULATORY_CARE_PROVIDER_SITE_OTHER): Payer: Medicaid Other

## 2015-11-29 DIAGNOSIS — Z3493 Encounter for supervision of normal pregnancy, unspecified, third trimester: Secondary | ICD-10-CM

## 2015-11-29 LAB — GLUCOSE, CAPILLARY: Glucose-Capillary: 193 mg/dL — ABNORMAL HIGH (ref 65–99)

## 2015-11-29 LAB — CBC
HEMATOCRIT: 35.5 % (ref 35.0–45.0)
HEMOGLOBIN: 12.1 g/dL (ref 11.7–15.5)
MCH: 29.7 pg (ref 27.0–33.0)
MCHC: 34.1 g/dL (ref 32.0–36.0)
MCV: 87.2 fL (ref 80.0–100.0)
MPV: 8.5 fL (ref 7.5–12.5)
Platelets: 288 10*3/uL (ref 140–400)
RBC: 4.07 MIL/uL (ref 3.80–5.10)
RDW: 13.4 % (ref 11.0–15.0)
WBC: 9.7 10*3/uL (ref 3.8–10.8)

## 2015-11-30 LAB — HIV ANTIBODY (ROUTINE TESTING W REFLEX): HIV 1&2 Ab, 4th Generation: NONREACTIVE

## 2015-11-30 LAB — RPR

## 2015-12-05 ENCOUNTER — Telehealth: Payer: Self-pay | Admitting: Internal Medicine

## 2015-12-05 DIAGNOSIS — R7309 Other abnormal glucose: Secondary | ICD-10-CM

## 2015-12-05 NOTE — Telephone Encounter (Signed)
Pt called and needs a letter from the doctor stating that she can have ex-rays at the dentist office so that she can have a filling done. [facility] [place] [facility] [place]. Please call when ready to pick up. jw

## 2015-12-08 ENCOUNTER — Encounter: Payer: Self-pay | Admitting: Internal Medicine

## 2015-12-08 NOTE — Telephone Encounter (Signed)
LVM for pt to call. If pt calls, please inform her that the letter is up front ready to be picked up. Sunday SpillersSharon T Brently Voorhis, CMA

## 2015-12-08 NOTE — Telephone Encounter (Signed)
Please call patient to let her know that I have done her letter for X-rays at her dentist. I have left it at the front desk and it is ready for pickup at her convenience.   Marcy Sirenatherine Wallace, D.O. 12/08/2015, 9:12 AM PGY-2, [facility]

## 2015-12-08 NOTE — Telephone Encounter (Signed)
Pt was informed. ep °

## 2015-12-09 ENCOUNTER — Ambulatory Visit (INDEPENDENT_AMBULATORY_CARE_PROVIDER_SITE_OTHER): Payer: Self-pay | Admitting: Internal Medicine

## 2015-12-09 DIAGNOSIS — Z3482 Encounter for supervision of other normal pregnancy, second trimester: Secondary | ICD-10-CM

## 2015-12-09 NOTE — Progress Notes (Signed)
Anna BanningLizbeth Lavell LusterRivera Green is a 25 y.o. G2P1001 at 3647w0d here for routine follow up.  She reports some cramping/tightening across her abdomen several times per day. Believes these may be contractions. Denies LOF and vaginal discharge.  See flow sheet for details.  A/P: Pregnancy at 5547w0d.  Doing well.   Pregnancy issues include Elevated 1hr GTT 193.  Infant feeding choice: Br/Bottle  Contraception choice: OCPs Infant circumcision desired: no  Tdap was not given today. [facility]. Confirmed with UC that she was given Tdap on 8/8.  GBS and gc/chlamydia testing was not performed today.  Patient needs 3hr GTT given remarkably elevated 1hr GTT. Has it scheduled for 8/14.   Suspect contractions are Braxton-Hicks as they are infrequent and patient has not had any cervical changes. Preterm labor and fetal movement precautions reviewed. Safe sleep discussed.  Follow up 1 week(s). Will need GBS and GC/Chlamydia at this appointment.   Able to locate anatomy US at the bottom of the order level scanning pile.

## 2015-12-09 NOTE — Patient Instructions (Signed)
Please return for your next prenantal visit in one week.   In the meantime please make a lab appointment for a 3 hour sugar test. It is very important that you have this done.

## 2015-12-13 ENCOUNTER — Other Ambulatory Visit: Payer: Self-pay

## 2015-12-13 NOTE — Telephone Encounter (Signed)
[facility] given 1hr GTT result of 193. Was informed that it is clinic policy to refer for results >190 on 1 hr testing.   Called patient to let her know that she does not need [facility].   [facility]. Patient voiced understanding that she will get a phone call to schedule with them. Discussed that [facility] will likely take over her prenatal care for the remainder of her pregnancy.   Marcy Sirenatherine Zhyon Antenucci, D.O. 12/13/2015, 9:49 AM PGY-2, [facility]

## 2015-12-19 ENCOUNTER — Telehealth: Payer: Self-pay | Admitting: Internal Medicine

## 2015-12-19 NOTE — Telephone Encounter (Signed)
Pt wonders if she is suppose to see dr Nancy Marusmayo before [facility] She states she will be 38 weeks [facility]  She was referred there because her sugar levels were high.please advise

## 2015-12-19 NOTE — Telephone Encounter (Signed)
This is Dr. Philis PiqueWallace's patient. I did not [facility]. Please forward any questions to Dr. Earlene PlaterWallace. Thank you!

## 2015-12-20 NOTE — Telephone Encounter (Signed)
[facility] due to elevated 1 hour glucola testing. Expect care will be taken over at that time. However, since patient is so far along and is due for GBS, GC/chlamydia testing, I would recommend she have a visit with us before then. Please call her to make an appointment. Thank you.

## 2015-12-27 NOTE — Telephone Encounter (Signed)
She can follow up with [facility] at her scheduled appointment on Thursday.   Marcy Sirenatherine Shoji Pertuit, D.O. 12/27/2015, 4:33 PM PGY-2, [facility]

## 2015-12-27 NOTE — Telephone Encounter (Signed)
Pt has appt with [facility] on Thursday.  Will forward to MD to see if she still wants her seen prior to that?  Anna Green, Anna RochesterJessica Dawn, CMA

## 2015-12-29 ENCOUNTER — Encounter: Payer: Self-pay | Admitting: Obstetrics & Gynecology

## 2015-12-29 ENCOUNTER — Ambulatory Visit (INDEPENDENT_AMBULATORY_CARE_PROVIDER_SITE_OTHER): Payer: Self-pay | Admitting: Obstetrics & Gynecology

## 2015-12-29 VITALS — BP 135/87 | HR 92 | Wt 197.8 lb

## 2015-12-29 DIAGNOSIS — O24913 Unspecified diabetes mellitus in pregnancy, third trimester: Secondary | ICD-10-CM

## 2015-12-29 DIAGNOSIS — Z3483 Encounter for supervision of other normal pregnancy, third trimester: Secondary | ICD-10-CM

## 2015-12-29 DIAGNOSIS — O24919 Unspecified diabetes mellitus in pregnancy, unspecified trimester: Secondary | ICD-10-CM | POA: Insufficient documentation

## 2015-12-29 LAB — OB RESULTS CONSOLE GC/CHLAMYDIA: Gonorrhea: NEGATIVE

## 2015-12-29 LAB — POCT URINALYSIS DIP (DEVICE)
Bilirubin Urine: NEGATIVE
Glucose, UA: NEGATIVE mg/dL
Ketones, ur: NEGATIVE mg/dL
NITRITE: NEGATIVE
PH: 5.5 (ref 5.0–8.0)
PROTEIN: NEGATIVE mg/dL
SPECIFIC GRAVITY, URINE: 1.025 (ref 1.005–1.030)
UROBILINOGEN UA: 0.2 mg/dL (ref 0.0–1.0)

## 2015-12-29 LAB — OB RESULTS CONSOLE GBS: GBS: POSITIVE

## 2015-12-29 LAB — GLUCOSE, CAPILLARY: Glucose-Capillary: 90 mg/dL (ref 65–99)

## 2015-12-29 NOTE — Patient Instructions (Signed)
Gestational Diabetes Mellitus  Gestational diabetes mellitus, often simply referred to as gestational diabetes, is a type of diabetes that some women develop during pregnancy. In gestational diabetes, the pancreas does not make enough insulin (a hormone), the cells are less responsive to the insulin that is made (insulin resistance), or both. Normally, insulin moves sugars from food into the tissue cells. The tissue cells use the sugars for energy. The lack of insulin or the lack of normal response to insulin causes excess sugars to build up in the blood instead of going into the tissue cells. As a result, high blood sugar (hyperglycemia) develops. The effect of high sugar (glucose) levels can cause many problems.   RISK FACTORS  You have an increased chance of developing gestational diabetes if you have a family history of diabetes and also have one or more of the following risk factors:  · A body mass index over 30 (obesity).  · A previous pregnancy with gestational diabetes.  · An older age at the time of pregnancy.  If blood glucose levels are kept in the normal range during pregnancy, women can have a healthy pregnancy. If your blood glucose levels are not well controlled, there may be risks to you, your unborn baby (fetus), your labor and delivery, or your newborn baby.   SYMPTOMS   If symptoms are experienced, they are much like symptoms you would normally expect during pregnancy. The symptoms of gestational diabetes include:   · Increased thirst (polydipsia).  · Increased urination (polyuria).  · Increased urination during the night (nocturia).  · Weight loss. This weight loss may be rapid.  · Frequent, recurring infections.  · Tiredness (fatigue).  · Weakness.  · Vision changes, such as blurred vision.  · Fruity smell to your breath.  · Abdominal pain.  DIAGNOSIS  Diabetes is diagnosed when blood glucose levels are increased. Your blood glucose level may be checked by one or more of the following blood  tests:  · A fasting blood glucose test. You will not be allowed to eat for at least 8 hours before a blood sample is taken.  · A random blood glucose test. Your blood glucose is checked at any time of the day regardless of when you ate.  · An oral glucose tolerance test (OGTT). Your blood glucose is measured after you have not eaten (fasted) for 1-3 hours and then after you drink a glucose-containing beverage. Since the hormones that cause insulin resistance are highest at about 24-28 weeks of a pregnancy, an OGTT is usually performed during that time. If you have risk factors, you may be screened for undiagnosed type 2 diabetes at your first prenatal visit.  TREATMENT   Gestational diabetes should be managed first with diet and exercise. Medicines may be added only if they are needed.  · You will need to take diabetes medicine or insulin daily to keep blood glucose levels in the desired range.  · You will need to match insulin dosing with exercise and healthy food choices.  If you have gestational diabetes, your treatment goal is to maintain the following blood glucose levels:  · Before meals (preprandial): at or below 95 mg/dL.  · After meals (postprandial):    One hour after a meal: at or below 140 mg/dL.    Two hours after a meal: at or below 120 mg/dL.  If you have pre-existing type 1 or type 2 diabetes, your treatment goal is to maintain the following blood glucose levels:  · Before   meals, at bedtime, and overnight: 60-99 mg/dL.  · After meals: peak of 100-129 mg/dL.  HOME CARE INSTRUCTIONS   · Have your hemoglobin A1c level checked twice a year.  · Perform daily blood glucose monitoring as directed by your health care provider. It is common to perform frequent blood glucose monitoring.  · Monitor urine ketones when you are ill and as directed by your health care provider.  · Take your diabetes medicine and insulin as directed by your health care provider to maintain your blood glucose level in the desired  range.  ¨ Never run out of diabetes medicine or insulin. It is needed every day.  ¨ Adjust insulin based on your intake of carbohydrates. Carbohydrates can raise blood glucose levels but need to be included in your diet. Carbohydrates provide vitamins, minerals, and fiber which are an essential part of a healthy diet. Carbohydrates are found in fruits, vegetables, whole grains, dairy products, legumes, and foods containing added sugars.  · Eat healthy foods. Alternate 3 meals with 3 snacks.  · Maintain a healthy weight gain. The usual total expected weight gain varies according to your prepregnancy body mass index (BMI).  · Carry a medical alert card or wear your medical alert jewelry.  · Carry a 15-gram carbohydrate snack with you at all times to treat low blood glucose (hypoglycemia). Some examples of 15-gram carbohydrate snacks include:  ¨ Glucose tablets, 3 or 4.  ¨ Glucose gel, 15-gram tube.  ¨ Raisins, 2 tablespoons (24 g).  ¨ Jelly beans, 6.  ¨ Animal crackers, 8.  ¨ Fruit juice, regular soda, or low-fat milk, 4 ounces (120 mL).  ¨ Gummy treats, 9.  · Recognize hypoglycemia. Hypoglycemia during pregnancy occurs with blood glucose levels of 60 mg/dL and below. The risk for hypoglycemia increases when fasting or skipping meals, during or after intense exercise, and during sleep. Hypoglycemia symptoms can include:  ¨ Tremors or shakes.  ¨ Decreased ability to concentrate.  ¨ Sweating.  ¨ Increased heart rate.  ¨ Headache.  ¨ Dry mouth.  ¨ Hunger.  ¨ Irritability.  ¨ Anxiety.  ¨ Restless sleep.  ¨ Altered speech or coordination.  ¨ Confusion.  · Treat hypoglycemia promptly. If you are alert and able to safely swallow, follow the 15:15 rule:  ¨ Take 15-20 grams of rapid-acting glucose or carbohydrate. Rapid-acting options include glucose gel, glucose tablets, or 4 ounces (120 mL) of fruit juice, regular soda, or low-fat milk.  ¨ Check your blood glucose level 15 minutes after taking the glucose.  ¨ Take 15-20  grams more of glucose if the repeat blood glucose level is still 70 mg/dL or below.  ¨ Eat a meal or snack within 1 hour once blood glucose levels return to normal.  · Be alert to polyuria (excess urination) and polydipsia (excess thirst) which are early signs of hyperglycemia. An early awareness of hyperglycemia allows for prompt treatment. Treat hyperglycemia as directed by your health care provider.  · Engage in at least 30 minutes of physical activity a day or as directed by your health care provider. Ten minutes of physical activity timed 30 minutes after each meal is encouraged to control postprandial blood glucose levels.  · Adjust your insulin dosing and food intake as needed if you start a new exercise or sport.  · Follow your sick-day plan at any time you are unable to eat or drink as usual.  · Avoid tobacco and alcohol use.  · Keep all follow-up visits as directed   by your health care provider.  · Follow the advice of your health care provider regarding your prenatal and post-delivery (postpartum) appointments, meal planning, exercise, medicines, vitamins, blood tests, other medical tests, and physical activities.  · Perform daily skin and foot care. Examine your skin and feet daily for cuts, bruises, redness, nail problems, bleeding, blisters, or sores.  · Brush your teeth and gums at least twice a day and floss at least once a day. Follow up with your dentist regularly.  · Schedule an eye exam during the first trimester of your pregnancy or as directed by your health care provider.  · Share your diabetes management plan with your workplace or school.  · Stay up-to-date with immunizations.  · Learn to manage stress.  · Obtain ongoing diabetes education and support as needed.  · Learn about and consider breastfeeding your baby.  · You should have your blood sugar level checked 6-12 weeks after delivery. This is done with an oral glucose tolerance test (OGTT).  SEEK MEDICAL CARE IF:   · You are unable to  eat food or drink fluids for more than 6 hours.  · You have nausea and vomiting for more than 6 hours.  · You have a blood glucose level of 200 mg/dL and you have ketones in your urine.  · There is a change in mental status.  · You develop vision problems.  · You have a persistent headache.  · You have upper abdominal pain or discomfort.  · You develop an additional serious illness.  · You have diarrhea for more than 6 hours.  · You have been sick or have had a fever for a couple of days and are not getting better.  SEEK IMMEDIATE MEDICAL CARE IF:   · You have difficulty breathing.  · You no longer feel the baby moving.  · You are bleeding or have discharge from your vagina.  · You start having premature contractions or labor.  MAKE SURE YOU:  · Understand these instructions.  · Will watch your condition.  · Will get help right away if you are not doing well or get worse.     This information is not intended to replace advice given to you by your health care provider. Make sure you discuss any questions you have with your health care provider.     Document Released: 07/23/2000 Document Revised: 05/07/2014 Document Reviewed: 11/13/2011  Elsevier Interactive Patient Education ©2016 [employer]

## 2015-12-29 NOTE — Progress Notes (Signed)
Here for intial prenatal visit with our office . [facility]. Has not had any instruction yet. Given patient education packets.  CBG=90 today. States ate last at midnight.  Given gestational diabetes packet with brief explanation.  Made appointment for GDM education for first available appt .

## 2015-12-29 NOTE — Progress Notes (Signed)
   [facility]IS[facility] NOTE [facility] Subjective:  Anna Green is a 25 y.o. G2P1001 at 5755w6d being seen today for ongoing prenatal care.  She is currently monitored for the following issues for this high-risk pregnancy and has Encounter for supervision of other normal pregnancy and Diabetes mellitus affecting pregnancy, antepartum on her problem list.  Patient reports occasional contractions.  Contractions: Irregular. Vag. Bleeding: None.  Movement: Present. Denies leaking of fluid.   The following portions of the patient's history were reviewed and updated as appropriate: allergies, current medications, past family history, past medical history, past social history, past surgical history and problem list. Problem list updated.  Objective:   Vitals:   12/29/15 0833  BP: 135/87  Pulse: 92  Weight: 197 lb 12.8 oz (89.7 kg)    Fetal Status: Fetal Heart Rate (bpm): 125   Movement: Present     General:  Alert, oriented and cooperative. Patient is in no acute distress.  Skin: Skin is warm and dry. No rash noted.   Cardiovascular: Normal heart rate noted  Respiratory: Normal respiratory effort, no problems with respiration noted  Abdomen: Soft, gravid, appropriate for gestational age. Pain/Pressure: Present     Pelvic:  Cervical exam performed        Extremities: Normal range of motion.  Edema: None  Mental Status: Normal mood and affect. Normal behavior. Normal judgment and thought content.   Urinalysis: Urine Protein: Negative Urine Glucose: Negative  Assessment and Plan:  Pregnancy: G2P1001 at 6655w6d  1. Encounter for supervision of other normal pregnancy in third trimester Needs growth US - POCT urinalysis dip (device) - Prenatal Multivit-Min-Fe-FA (PRENATAL VITAMINS PO); Take 1 tablet by mouth daily. - Glucose Tolerance, 1 HR (50g) - Culture, beta strep (group b only) - Glucose, capillary - US MFM OB COMP + 14 WK; Future  2. Diabetes mellitus affecting  pregnancy, antepartum DM teaching, give instructions for testing ASAP - US MFM OB COMP + 14 WK; Future  Term labor symptoms and general obstetric precautions including but not limited to vaginal bleeding, contractions, leaking of fluid and fetal movement were reviewed in detail with the patient. Please refer to After Visit Summary for other counseling recommendations.  RTC ASAP next week Adam PhenixJames G Beverly Ferner, MD

## 2015-12-30 LAB — GC/CHLAMYDIA PROBE AMP (~~LOC~~) NOT AT ARMC
Chlamydia: NEGATIVE
Neisseria Gonorrhea: NEGATIVE

## 2015-12-31 LAB — CULTURE, BETA STREP (GROUP B ONLY)

## 2016-01-04 ENCOUNTER — Ambulatory Visit (HOSPITAL_COMMUNITY)
Admission: RE | Admit: 2016-01-04 | Discharge: 2016-01-04 | Disposition: A | Discharge status: Home / Self Care | Payer: Self-pay | Source: Ambulatory Visit | Attending: Obstetrics & Gynecology | Admitting: Obstetrics & Gynecology

## 2016-01-04 ENCOUNTER — Other Ambulatory Visit: Payer: Self-pay | Admitting: Obstetrics & Gynecology

## 2016-01-04 DIAGNOSIS — O24913 Unspecified diabetes mellitus in pregnancy, third trimester: Secondary | ICD-10-CM | POA: Insufficient documentation

## 2016-01-04 DIAGNOSIS — Z36 Encounter for antenatal screening of mother: Secondary | ICD-10-CM | POA: Insufficient documentation

## 2016-01-04 DIAGNOSIS — Z1389 Encounter for screening for other disorder: Secondary | ICD-10-CM

## 2016-01-04 DIAGNOSIS — O24919 Unspecified diabetes mellitus in pregnancy, unspecified trimester: Secondary | ICD-10-CM

## 2016-01-04 DIAGNOSIS — Z3483 Encounter for supervision of other normal pregnancy, third trimester: Secondary | ICD-10-CM

## 2016-01-04 DIAGNOSIS — Z3A38 38 weeks gestation of pregnancy: Secondary | ICD-10-CM | POA: Insufficient documentation

## 2016-01-05 ENCOUNTER — Ambulatory Visit (INDEPENDENT_AMBULATORY_CARE_PROVIDER_SITE_OTHER): Payer: Self-pay | Admitting: Advanced Practice Midwife

## 2016-01-05 ENCOUNTER — Ambulatory Visit (HOSPITAL_COMMUNITY): Admission: RE | Admit: 2016-01-05 | Payer: Self-pay | Source: Ambulatory Visit

## 2016-01-05 ENCOUNTER — Ambulatory Visit (HOSPITAL_COMMUNITY): Payer: Self-pay

## 2016-01-05 ENCOUNTER — Encounter: Payer: Self-pay | Admitting: Advanced Practice Midwife

## 2016-01-05 VITALS — BP 121/81 | HR 92 | Wt 197.3 lb

## 2016-01-05 DIAGNOSIS — O09893 Supervision of other high risk pregnancies, third trimester: Secondary | ICD-10-CM

## 2016-01-05 DIAGNOSIS — O24919 Unspecified diabetes mellitus in pregnancy, unspecified trimester: Secondary | ICD-10-CM

## 2016-01-05 LAB — POCT URINALYSIS DIP (DEVICE)
Bilirubin Urine: NEGATIVE
Glucose, UA: NEGATIVE mg/dL
HGB URINE DIPSTICK: NEGATIVE
KETONES UR: NEGATIVE mg/dL
Nitrite: NEGATIVE
PROTEIN: NEGATIVE mg/dL
SPECIFIC GRAVITY, URINE: 1.02 (ref 1.005–1.030)
UROBILINOGEN UA: 0.2 mg/dL (ref 0.0–1.0)
pH: 6 (ref 5.0–8.0)

## 2016-01-05 LAB — GLUCOSE, CAPILLARY: Glucose-Capillary: 88 mg/dL (ref 65–99)

## 2016-01-05 NOTE — Progress Notes (Signed)
Fasting since 11pm last night .  Cbg=88, large leukocytes noted in urine.

## 2016-01-06 ENCOUNTER — Inpatient Hospital Stay (HOSPITAL_COMMUNITY)
Admission: RE | Admit: 2016-01-06 | Discharge: 2016-01-08 | DRG: 774 | Disposition: A | Discharge status: Home / Self Care | Payer: Medicaid Other | Source: Ambulatory Visit | Attending: Obstetrics and Gynecology | Admitting: Obstetrics and Gynecology

## 2016-01-06 ENCOUNTER — Encounter (HOSPITAL_COMMUNITY): Payer: Self-pay

## 2016-01-06 DIAGNOSIS — Z3490 Encounter for supervision of normal pregnancy, unspecified, unspecified trimester: Secondary | ICD-10-CM

## 2016-01-06 DIAGNOSIS — O24420 Gestational diabetes mellitus in childbirth, diet controlled: Principal | ICD-10-CM | POA: Diagnosis present

## 2016-01-06 DIAGNOSIS — Z3A39 39 weeks gestation of pregnancy: Secondary | ICD-10-CM

## 2016-01-06 DIAGNOSIS — O99824 Streptococcus B carrier state complicating childbirth: Secondary | ICD-10-CM | POA: Diagnosis present

## 2016-01-06 DIAGNOSIS — O24429 Gestational diabetes mellitus in childbirth, unspecified control: Secondary | ICD-10-CM | POA: Diagnosis not present

## 2016-01-06 LAB — CBC
HEMATOCRIT: 33.9 % — AB (ref 36.0–46.0)
HEMOGLOBIN: 11.9 g/dL — AB (ref 12.0–15.0)
MCH: 29.4 pg (ref 26.0–34.0)
MCHC: 35.1 g/dL (ref 30.0–36.0)
MCV: 83.7 fL (ref 78.0–100.0)
Platelets: 305 10*3/uL (ref 150–400)
RBC: 4.05 MIL/uL (ref 3.87–5.11)
RDW: 13.8 % (ref 11.5–15.5)
WBC: 9.2 10*3/uL (ref 4.0–10.5)

## 2016-01-06 LAB — ABO/RH: ABO/RH(D): B POS

## 2016-01-06 LAB — TYPE AND SCREEN
ABO/RH(D): B POS
ANTIBODY SCREEN: NEGATIVE

## 2016-01-06 LAB — GLUCOSE, CAPILLARY: Glucose-Capillary: 74 mg/dL (ref 65–99)

## 2016-01-06 LAB — RPR: RPR: NONREACTIVE

## 2016-01-06 MED ORDER — LACTATED RINGERS IV SOLN: 500.0000 mL | INTRAVENOUS | Status: DC | PRN

## 2016-01-06 MED ORDER — PENICILLIN G POTASSIUM 5000000 UNITS IJ SOLR
2.5000 10*6.[IU] | INTRAMUSCULAR | Status: DC
  Administered 2016-01-06 – 2016-01-07 (×3): 2.5 10*6.[IU] via INTRAVENOUS
  Filled 2016-01-06 (×7): qty 2.5

## 2016-01-06 MED ORDER — OXYTOCIN 40 UNITS IN LACTATED RINGERS INFUSION - SIMPLE MED
2.5000 [IU]/h | INTRAVENOUS | Status: DC
  Filled 2016-01-06: qty 1000

## 2016-01-06 MED ORDER — OXYCODONE-ACETAMINOPHEN 5-325 MG PO TABS: 1.0000 | ORAL_TABLET | ORAL | Status: DC | PRN

## 2016-01-06 MED ORDER — PENICILLIN G POTASSIUM 5000000 UNITS IJ SOLR
5.0000 10*6.[IU] | Freq: Once | INTRAVENOUS | Status: AC
  Administered 2016-01-06: 5 10*6.[IU] via INTRAVENOUS
  Filled 2016-01-06: qty 5

## 2016-01-06 MED ORDER — OXYCODONE-ACETAMINOPHEN 5-325 MG PO TABS
2.0000 | ORAL_TABLET | ORAL | Status: DC | PRN
Start: 2016-01-06 — End: 2016-01-07

## 2016-01-06 MED ORDER — ACETAMINOPHEN 325 MG PO TABS: 650.0000 mg | ORAL_TABLET | ORAL | Status: DC | PRN

## 2016-01-06 MED ORDER — OXYTOCIN 40 UNITS IN LACTATED RINGERS INFUSION - SIMPLE MED
1.0000 m[IU]/min | INTRAVENOUS | Status: DC
  Administered 2016-01-06: 2 m[IU]/min via INTRAVENOUS

## 2016-01-06 MED ORDER — OXYTOCIN BOLUS FROM INFUSION: 500.0000 mL | Freq: Once | INTRAVENOUS | Status: DC

## 2016-01-06 MED ORDER — LACTATED RINGERS IV SOLN
INTRAVENOUS | Status: DC
  Administered 2016-01-06: 09:00:00 via INTRAVENOUS

## 2016-01-06 MED ORDER — LIDOCAINE HCL (PF) 1 % IJ SOLN
30.0000 mL | INTRAMUSCULAR | Status: DC | PRN
  Administered 2016-01-07: 30 mL via SUBCUTANEOUS
  Filled 2016-01-06: qty 30

## 2016-01-06 MED ORDER — TERBUTALINE SULFATE 1 MG/ML IJ SOLN
0.2500 mg | Freq: Once | INTRAMUSCULAR | Status: DC | PRN
  Filled 2016-01-06: qty 1

## 2016-01-06 MED ORDER — ONDANSETRON HCL 4 MG/2ML IJ SOLN: 4.0000 mg | Freq: Four times a day (QID) | INTRAMUSCULAR | Status: DC | PRN

## 2016-01-06 MED ORDER — SOD CITRATE-CITRIC ACID 500-334 MG/5ML PO SOLN: 30.0000 mL | ORAL | Status: DC | PRN

## 2016-01-06 NOTE — Progress Notes (Signed)
LABOR PROGRESS NOTE  Anna Green is a 25 y.o. G2P1001 at 2542w0d  admitted for IOL for GDM.  Subjective: Doing well. Denies any concerns  Objective: BP 99/67   Pulse 90   Temp 98.4 F (36.9 C) (Oral)   Resp 18   Ht 5\' 3"  (1.6 m)   Wt 197 lb (89.4 kg)   LMP 04/08/2015 (Exact Date)   BMI 34.90 kg/m  or  Vitals:   01/06/16 1300 01/06/16 1349 01/06/16 1400 01/06/16 1430  BP: 136/85 110/76 108/66 99/67  Pulse: 94 91 78 90  Resp: 18  18 18   Temp:      TempSrc:      Weight:      Height:        NAD. Appear comfortable. Dilation: 3 Effacement (%): 50 Station: -3 Presentation: Vertex Exam by:: Foye ClockS. Oklesh RN  Labs: Lab Results  Component Value Date   WBC 9.2 01/06/2016   HGB 11.9 (L) 01/06/2016   HCT 33.9 (L) 01/06/2016   MCV 83.7 01/06/2016   PLT 305 01/06/2016    Patient Active Problem List   Diagnosis Date Noted  . Pregnancy 01/06/2016  . Diabetes mellitus affecting pregnancy, antepartum 12/29/2015  . Encounter for supervision of other normal pregnancy 10/06/2015    Assessment / Plan: 25 y.o. G2P1001 at 3342w0d here for IOL for GDM.  Labor: Continue Pitocin Fetal Wellbeing:  Category I Pain Control:   May have Epidural when in active labor per patient request Anticipated MOD:  SVD  Frederik PearJulie P Tlaloc Taddei, MD 01/06/2016, 2:37 PM

## 2016-01-06 NOTE — Anesthesia Pain Management Evaluation Note (Signed)
  CRNA Pain Management Visit Note  Patient: Anna Green, 25 y.o., female  "Hello I am a [facility] of the anesthesia [facility]. We have an anesthesia team available at all times to provide care throughout the hospital, including epidural management and anesthesia for C-section. I don't know your plan for the delivery whether it a natural birth, water birth, IV sedation, nitrous supplementation, doula or epidural, but we want to meet your pain goals."   1.Was your pain managed to your expectations on prior hospitalizations?   Yes   2.What is your expectation for pain management during this hospitalization?     Nitrous Oxide  3.How can we help you reach that goal? unsure  Record the patient's initial score and the patient's pain goal.   Pain: 0  Pain Goal: 10 [facility] wants you to be able to say your pain was always managed very well.  Cephus ShellingBURGER,Lynnsey Barbara 01/06/2016

## 2016-01-06 NOTE — H&P (Signed)
LABOR AND DELIVERY ADMISSION HISTORY AND PHYSICAL NOTE  Anna Green is a 25 y.o. female G2P1001 with IUP at 5959w0d by LMP presenting for IOL for GDM. She reports positive fetal movement. She denies leakage of fluid or vaginal bleeding.  Prenatal History/Complications: - GDM diagnosed in 3rd trimester. EFW 87%ile at 6542w5d - GBS+  Past Medical History: History reviewed. No pertinent past medical history.  Past Surgical History: History reviewed. No pertinent surgical history.  Obstetrical History: OB History    Gravida Para Term Preterm AB Living   2 1 1  0 0 1   SAB TAB Ectopic Multiple Live Births   0 0 0 0        Social History: Social History   Social History  . Marital status: Single    Spouse name: N/A  . Number of children: N/A  . Years of education: N/A   Social History Main Topics  . Smoking status: Never Smoker  . Smokeless tobacco: Never Used  . Alcohol use No  . Drug use: No  . Sexual activity: Yes    Birth control/ protection: None   Other Topics Concern  . None   Social History Narrative  . None    Family History: Family History  Problem Relation Age of Onset  . Cancer Father     Allergies: No Known Allergies  Prescriptions Prior to Admission  Medication Sig Dispense Refill Last Dose  . Prenatal Multivit-Min-Fe-FA (PRENATAL VITAMINS PO) Take 1 tablet by mouth daily.   01/05/2016 at Unknown time     Review of Systems  All systems reviewed and negative except as stated in HPI  Blood pressure (!) 138/93, pulse (!) 102, temperature 98.2 F (36.8 C), temperature source Oral, resp. rate 18, height 5\' 3"  (1.6 m), weight 197 lb (89.4 kg), last menstrual period 04/08/2015. General appearance: alert and no distress Lungs: clear to auscultation bilaterally Heart: regular rate and rhythm Abdomen: soft, non-tender; bowel sounds normal Extremities: No calf swelling or tenderness Presentation: cephalic by U/S 2 days ago Fetal monitoring:  140, moderate variability, +acel, no decel Uterine activity: ctx q3-5 (pt not feeling them) Dilation: 2.5 Effacement (%): 50 Station: -3 Exam by:: Foye ClockS. Oklesh RN  Prenatal labs: ABO, Rh: B/POS/-- (01/27 1102) Antibody: NEG (01/27 1102) Rubella: immune  RPR: NON REAC (08/01 1001)  HBsAg: NEGATIVE (01/27 1102)  HIV: NONREACTIVE (08/01 1001)  GBS:   Positive 1 hr Glucola: 123 (early), 193 (3rd trimester) Genetic screening:  Normal Quad Anatomy US: normal  Prenatal Transfer Tool  Maternal Diabetes: Gestational GDMA1 Genetic Screening: Normal Maternal Ultrasounds/Referrals: Normal Fetal Ultrasounds or other Referrals:  None Maternal Substance Abuse:  No Significant Maternal Medications:  None Significant Maternal Lab Results: None  Results for orders placed or performed during the hospital encounter of 01/06/16 (from the past 24 hour(s))  CBC   Collection Time: 01/06/16  8:30 AM  Result Value Ref Range   WBC 9.2 4.0 - 10.5 K/uL   RBC 4.05 3.87 - 5.11 MIL/uL   Hemoglobin 11.9 (L) 12.0 - 15.0 g/dL   HCT 78.433.9 (L) 69.636.0 - 29.546.0 %   MCV 83.7 78.0 - 100.0 fL   MCH 29.4 26.0 - 34.0 pg   MCHC 35.1 30.0 - 36.0 g/dL   RDW 28.413.8 13.211.5 - 44.015.5 %   Platelets 305 150 - 400 K/uL  Glucose, capillary   Collection Time: 01/06/16  9:40 AM  Result Value Ref Range   Glucose-Capillary 74 65 - 99 mg/dL  Patient Active Problem List   Diagnosis Date Noted  . Pregnancy 01/06/2016  . Diabetes mellitus affecting pregnancy, antepartum 12/29/2015  . Encounter for supervision of other normal pregnancy 10/06/2015    Assessment: Anna Green is a 25 y.o. G2P1001 at [redacted]w[redacted]d here for IOL for GDMA1. Admit CBG 74.  #Labor: Favorable cervix. Start Pitocin #Pain: May have epidural when in active labor per pt request #FWB: Category I #ID:  GBS+, PCN ordred #MOF: breast and bottle #MOC: OCPs vs Mirena #Circ:  Does not desire  Kandra Nicolas Degele 01/06/2016, 9:54 AM  OB FELLOW HISTORY AND PHYSICAL  ATTESTATION  I have seen and examined this patient; I agree with above documentation in the resident's note.    Ernestina Penna 01/06/2016, 11:31 AM

## 2016-01-06 NOTE — Progress Notes (Signed)
Pt doing well. No concerns at this time. On pitocin. Continue to increase as needed.

## 2016-01-07 ENCOUNTER — Encounter (HOSPITAL_COMMUNITY): Payer: Self-pay

## 2016-01-07 DIAGNOSIS — Z3A39 39 weeks gestation of pregnancy: Secondary | ICD-10-CM

## 2016-01-07 DIAGNOSIS — O24429 Gestational diabetes mellitus in childbirth, unspecified control: Secondary | ICD-10-CM

## 2016-01-07 DIAGNOSIS — O99824 Streptococcus B carrier state complicating childbirth: Secondary | ICD-10-CM

## 2016-01-07 LAB — CBC
HCT: 32.1 % — ABNORMAL LOW (ref 36.0–46.0)
HEMOGLOBIN: 11.3 g/dL — AB (ref 12.0–15.0)
MCH: 29.3 pg (ref 26.0–34.0)
MCHC: 35.2 g/dL (ref 30.0–36.0)
MCV: 83.2 fL (ref 78.0–100.0)
Platelets: 306 10*3/uL (ref 150–400)
RBC: 3.86 MIL/uL — AB (ref 3.87–5.11)
RDW: 13.6 % (ref 11.5–15.5)
WBC: 16.6 10*3/uL — ABNORMAL HIGH (ref 4.0–10.5)

## 2016-01-07 LAB — GLUCOSE, CAPILLARY: GLUCOSE-CAPILLARY: 79 mg/dL (ref 65–99)

## 2016-01-07 MED ORDER — SODIUM CHLORIDE 0.9 % IV SOLN: 250.0000 mL | INTRAVENOUS | Status: DC | PRN

## 2016-01-07 MED ORDER — FENTANYL CITRATE (PF) 100 MCG/2ML IJ SOLN
100.0000 ug | INTRAMUSCULAR | Status: DC | PRN
  Administered 2016-01-07: 100 ug via INTRAVENOUS
  Filled 2016-01-07: qty 2

## 2016-01-07 MED ORDER — IBUPROFEN 600 MG PO TABS
600.0000 mg | ORAL_TABLET | Freq: Four times a day (QID) | ORAL | Status: DC
  Administered 2016-01-07 – 2016-01-08 (×5): 600 mg via ORAL
  Filled 2016-01-07 (×5): qty 1

## 2016-01-07 MED ORDER — OXYCODONE HCL 5 MG PO TABS
5.0000 mg | ORAL_TABLET | Freq: Four times a day (QID) | ORAL | Status: DC | PRN
  Administered 2016-01-07 – 2016-01-08 (×2): 5 mg via ORAL
  Filled 2016-01-07 (×2): qty 1

## 2016-01-07 MED ORDER — PRENATAL MULTIVITAMIN CH
1.0000 | ORAL_TABLET | Freq: Every day | ORAL | Status: DC
  Administered 2016-01-08: 1 via ORAL
  Filled 2016-01-07: qty 1

## 2016-01-07 MED ORDER — SODIUM CHLORIDE 0.9% FLUSH: 3.0000 mL | INTRAVENOUS | Status: DC | PRN

## 2016-01-07 MED ORDER — SENNOSIDES-DOCUSATE SODIUM 8.6-50 MG PO TABS
2.0000 | ORAL_TABLET | ORAL | Status: DC
  Administered 2016-01-08: 2 via ORAL
  Filled 2016-01-07: qty 2

## 2016-01-07 MED ORDER — OXYTOCIN 40 UNITS IN LACTATED RINGERS INFUSION - SIMPLE MED: 2.5000 [IU]/h | INTRAVENOUS | Status: DC | PRN

## 2016-01-07 MED ORDER — SIMETHICONE 80 MG PO CHEW: 80.0000 mg | CHEWABLE_TABLET | ORAL | Status: DC | PRN

## 2016-01-07 MED ORDER — ACETAMINOPHEN 325 MG PO TABS
650.0000 mg | ORAL_TABLET | ORAL | Status: DC | PRN
  Administered 2016-01-07 – 2016-01-08 (×3): 650 mg via ORAL
  Filled 2016-01-07 (×3): qty 2

## 2016-01-07 MED ORDER — COCONUT OIL OIL: 1.0000 "application " | TOPICAL_OIL | Status: DC | PRN

## 2016-01-07 MED ORDER — MISOPROSTOL 200 MCG PO TABS
ORAL_TABLET | ORAL | Status: AC
Start: 2016-01-07 — End: 2016-01-07
  Filled 2016-01-07: qty 4

## 2016-01-07 MED ORDER — ZOLPIDEM TARTRATE 5 MG PO TABS: 5.0000 mg | ORAL_TABLET | Freq: Every evening | ORAL | Status: DC | PRN

## 2016-01-07 MED ORDER — METHYLERGONOVINE MALEATE 0.2 MG/ML IJ SOLN
INTRAMUSCULAR | Status: AC
  Administered 2016-01-07: 0.2 mg
  Filled 2016-01-07: qty 1

## 2016-01-07 MED ORDER — SODIUM CHLORIDE 0.9% FLUSH: 3.0000 mL | Freq: Two times a day (BID) | INTRAVENOUS | Status: DC

## 2016-01-07 MED ORDER — IBUPROFEN 600 MG PO TABS
600.0000 mg | ORAL_TABLET | Freq: Four times a day (QID) | ORAL | Status: DC
  Administered 2016-01-07: 600 mg via ORAL
  Filled 2016-01-07: qty 1

## 2016-01-07 MED ORDER — ONDANSETRON HCL 4 MG PO TABS: 4.0000 mg | ORAL_TABLET | ORAL | Status: DC | PRN

## 2016-01-07 MED ORDER — DIBUCAINE 1 % RE OINT: 1.0000 "application " | TOPICAL_OINTMENT | RECTAL | Status: DC | PRN

## 2016-01-07 MED ORDER — DIPHENHYDRAMINE HCL 25 MG PO CAPS: 25.0000 mg | ORAL_CAPSULE | Freq: Four times a day (QID) | ORAL | Status: DC | PRN

## 2016-01-07 MED ORDER — ONDANSETRON HCL 4 MG/2ML IJ SOLN: 4.0000 mg | INTRAMUSCULAR | Status: DC | PRN

## 2016-01-07 MED ORDER — BENZOCAINE-MENTHOL 20-0.5 % EX AERO
1.0000 "application " | INHALATION_SPRAY | CUTANEOUS | Status: DC | PRN
  Administered 2016-01-07: 1 via TOPICAL
  Filled 2016-01-07: qty 56

## 2016-01-07 MED ORDER — TETANUS-DIPHTH-ACELL PERTUSSIS 5-2.5-18.5 LF-MCG/0.5 IM SUSP: 0.5000 mL | Freq: Once | INTRAMUSCULAR | Status: DC

## 2016-01-07 MED ORDER — WITCH HAZEL-GLYCERIN EX PADS: 1.0000 "application " | MEDICATED_PAD | CUTANEOUS | Status: DC | PRN

## 2016-01-07 MED ORDER — MISOPROSTOL 200 MCG PO TABS
800.0000 ug | ORAL_TABLET | Freq: Once | ORAL | Status: AC
  Administered 2016-01-07: 800 ug via RECTAL

## 2016-01-07 NOTE — Progress Notes (Signed)
Post Partum Day 0  Subjective: Anna Green is a 25 yo G2P2 PPD #0 s/p IOL for gDMA1.  She delivered a viable, healthy boy vaginally at 2:30am.   no complaints, up ad lib and tolerating PO.  Endorses some vaginal pain from 2nd degree laceration.  She also endorses some bleeding that has decreased since delivery. Pt denies fever, nausea, vomiting and abdominal pain.  She has no flatus or bowel movement yet.    She is breast feeding and has no problems with this so far. Plans on pills for birth control. Does not desire a circumcision for her baby.  Objective: Blood pressure 133/80, pulse 82, temperature 98.8 F (37.1 C), temperature source Oral, resp. rate 18, height 5\' 3"  (1.6 m), weight 89.4 kg (197 lb), last menstrual period 04/08/2015, SpO2 100 %, unknown if currently breastfeeding.  Physical Exam:  General: alert, cooperative and no distress Lochia: appropriate Uterine Fundus: firm DVT Evaluation: No evidence of DVT seen on physical exam.   Recent Labs  01/06/16 0830 01/07/16 0604  HGB 11.9* 11.3*  HCT 33.9* 32.1*    Assessment/Plan: Continue postpartum care. Tylenol PRN for pain control     LOS: 1 day   Richard Godine 01/07/2016, 6:51 AM    OB FELLOW MEDICAL STUDENT NOTE ATTESTATION  I have seen and examined this patient. Note this is a Psychologist, occupationalmedical student note and as such does not necessarily reflect the patient's plan of care. Please see progress note for this date of service.    Jen MowElizabeth Kaymon Denomme, DO OB Fellow 01/09/2016, 7:15 PM

## 2016-01-07 NOTE — Lactation Note (Signed)
This note was copied from a baby's chart. Lactation Consultation Note  P2, 12 hours old.  Room full of visitors. States she breastfed first child for a few weeks and did not like breastfeeding. Wants to do breastfeed and give formula.  Mother states she knows how to hand express. Mom encouraged to feed baby 8-12 times/24 hours and with feeding cues.  Per mother, baby breastfed recently for 30 min. Suggest she call LC for assistance w/ breastfeeding. Mom made aware of O/P services, breastfeeding support groups, community resources, and our phone # for post-discharge questions.     Patient Name: Anna Landry DykeLizbeth Rivera Green [id number]'s Date: 01/07/2016 Reason for consult: Initial assessment   Maternal Data Has patient been taught Hand Expression?: Yes  Feeding Feeding Type: Breast Fed Length of feed: 30 min  LATCH Score/Interventions                      Lactation Tools Discussed/Used     Consult Status Consult Status: Follow-up Date: 01/08/16 Follow-up type: In-patient    Dahlia ByesBerkelhammer, Dozier Berkovich [facility] 01/07/2016, 2:41 PM

## 2016-01-08 DIAGNOSIS — O24429 Gestational diabetes mellitus in childbirth, unspecified control: Secondary | ICD-10-CM

## 2016-01-08 DIAGNOSIS — Z3A39 39 weeks gestation of pregnancy: Secondary | ICD-10-CM

## 2016-01-08 DIAGNOSIS — O99824 Streptococcus B carrier state complicating childbirth: Secondary | ICD-10-CM

## 2016-01-08 MED ORDER — IBUPROFEN 600 MG PO TABS: 600.0000 mg | ORAL_TABLET | Freq: Four times a day (QID) | ORAL | 0 refills | Status: DC

## 2016-01-08 NOTE — Progress Notes (Signed)
MOB was referred for history of depression/anxiety.  Referral is screened out by Clinical Social Worker because none of the following criteria appear to apply and there are no reports impacting the pregnancy or her transition to the postpartum period.  CSW does not deem it clinically necessary to further investigate at this time.   -History of anxiety/depression during this pregnancy, or of post-partum depression.  - Diagnosis of anxiety and/or depression within last 3 years.-  - History of depression due to pregnancy loss/loss of child or -MOB's symptoms are currently being treated with medication and/or therapy.  Please contact the Clinical Social Worker if needs arise or upon MOB request.    Alphonse Asbridge, MSW, LCSW-A Clinical Social Worker  [facility]  Office: [phone]   

## 2016-01-08 NOTE — Discharge Summary (Signed)
OB Discharge Summary  Patient Name: Anna Green DOB: 18-Dec-1990 MRN: [id number]  Date of admission: 01/06/2016 Delivering MD: Merril Abbe   Date of discharge: 01/08/2016  Admitting diagnosis: induction Intrauterine pregnancy: [redacted]w[redacted]d     Secondary diagnosis:Active Problems:   Pregnancy  Additional problems:none     Discharge diagnosis: Term Pregnancy Delivered, GDM A1 and PPH                                                                     Post partum procedures:PPH  Augmentation: Pitocin  Complications: None  Hospital course:  Induction of Labor With Vaginal Delivery   25 y.o. yo [id number] at [redacted]w[redacted]d was admitted to the hospital 01/06/2016 for induction of labor.  Indication for induction: A1 DM.  Patient had an uncomplicated labor course as follows: Membrane Rupture Time/Date: 11:01 PM ,01/06/2016   Intrapartum Procedures: Episiotomy: None [1]                                         Lacerations:  2nd degree [3];Perineal [11]  Patient had delivery of a Viable infant.  Information for the patient's newborn:  Sanaa, Zilberman [[id number]]  Delivery Method: Vaginal, Spontaneous Delivery (Filed from Delivery Summary)   01/07/2016  Details of delivery can be found in separate delivery note.  Patient had a routine postpartum course. Patient is discharged home 01/08/16.   Physical exam Vitals:   01/07/16 0615 01/07/16 1034 01/07/16 1815 01/08/16 0605  BP: 118/67 117/74 123/74 118/76  Pulse: 87 82 86 81  Resp: 18 20 18 18   Temp: 98.9 F (37.2 C) 98.5 F (36.9 C) 98.7 F (37.1 C) 98.2 F (36.8 C)  TempSrc: Oral Oral Oral Oral  SpO2:   100%   Weight:      Height:       General: alert, cooperative and no distress Lochia: appropriate Uterine Fundus: firm Incision: N/A DVT Evaluation: No evidence of DVT seen on physical exam. Negative Homan's sign. No cords or calf tenderness. No significant calf/ankle edema. Labs: Lab Results  Component Value  Date   WBC 16.6 (H) 01/07/2016   HGB 11.3 (L) 01/07/2016   HCT 32.1 (L) 01/07/2016   MCV 83.2 01/07/2016   PLT 306 01/07/2016   CMP Latest Ref Rng & Units 08/30/2009  Glucose 70 - 99 mg/dL 95  BUN 6 - 23 mg/dL 8  Creatinine 0.4 - 1.2 mg/dL 1.30  Sodium 865 - 784 mEq/L 136  Potassium 3.5 - 5.1 mEq/L 3.5  Chloride 96 - 112 mEq/L 107  CO2 19 - 32 mEq/L 22  Calcium 8.4 - 10.5 mg/dL 8.9  Total Protein 6.0 - 8.3 g/dL 6.5  Total Bilirubin 0.3 - 1.2 mg/dL 6.9(G)  Alkaline Phos 39 - 117 U/L 147(H)  AST 0 - 37 U/L 20  ALT 0 - 35 U/L 15    Discharge instruction: per After Visit Summary and "Baby and Me Booklet".    Diet: routine diet  Activity: Advance as tolerated. Pelvic rest for 6 weeks.   Outpatient follow up:6 weeks Follow up Appt:Future Appointments Date Time Provider Department Center  02/16/2016  10:20 AM Marlis EdelsonWalidah N Karim, CNM [facility]   Follow up visit: No Follow-up on file.  Postpartum contraception: IUD Mirena  Newborn Data: Live born female  Birth Weight: 7 lb 0.7 oz (3195 g) APGAR: 7, 9  Baby Feeding: Bottle and Breast Disposition:home with mother   01/08/2016 Renetta ChalkAshley Ellis, Student-MidWife

## 2016-01-08 NOTE — Progress Notes (Signed)
Post Partum Day 1 Subjective: no complaints, up ad lib, voiding and tolerating PO  Objective: Blood pressure 118/76, pulse 81, temperature 98.2 F (36.8 C), temperature source Oral, resp. rate 18, height 5\' 3"  (1.6 m), weight 197 lb (89.4 kg), last menstrual period 04/08/2015, SpO2 100 %, unknown if currently breastfeeding.  Physical Exam:  General: alert, cooperative, appears stated age and no distress Lochia: appropriate Uterine Fundus: firm Incision: n/a DVT Evaluation: No evidence of DVT seen on physical exam. Negative Homan's sign. No cords or calf tenderness.   Recent Labs  01/06/16 0830 01/07/16 0604  HGB 11.9* 11.3*  HCT 33.9* 32.1*    Assessment/Plan: Plan for discharge tomorrow   LOS: 2 days   Anna Green 01/08/2016, 9:27 AM

## 2016-01-08 NOTE — Lactation Note (Signed)
This note was copied from a baby's chart. Lactation Consultation Note  Mother states baby is feeding often.  Described cluster feeding and the importance of feeding on demand, supply and demand. Recommend breastfeeding on both sides before offering formula. Provided education about how milk transitions to volume. Mom encouraged to feed baby 8-12 times/24 hours and with feeding cues.  Provided mother w/ hand pump. Reviewed engorgement care and monitoring voids/stools.  Patient Name: [name] [id number]'UToday's Date: 01/08/2016 Reason for consult: Follow-up assessment   Maternal Data    Feeding Feeding Type: Bottle Fed - Formula Nipple Type: Slow - flow  LATCH Score/Interventions                      Lactation Tools Discussed/Used     Consult Status Consult Status: Complete    Hardie PulleyBerkelhammer, Blondine Hottel Boschen 01/08/2016, 12:06 PM

## 2016-01-12 NOTE — Progress Notes (Signed)
   PRENATAL VISIT NOTE  Subjective:  Anna Green is a 25 y.o. G2P2002 at 59109w6d being seen today for ongoing prenatal care.  She is currently monitored for the following issues for this high-risk pregnancy and has Encounter for supervision of other normal pregnancy; Diabetes mellitus affecting pregnancy, antepartum; and Pregnancy on her problem list.   New Dx GDM. Has not started testing blood sugars.   Patient reports occasional contractions.  Contractions: Irregular. Vag. Bleeding: None.  Movement: Present. Denies leaking of fluid.   The following portions of the patient's history were reviewed and updated as appropriate: allergies, current medications, past family history, past medical history, past social history, past surgical history and problem list. Problem list updated.  Objective:   Vitals:   01/05/16 0922  BP: 121/81  Pulse: 92  Weight: 197 lb 4.8 oz (89.5 kg)    Fetal Status: Fetal Heart Rate (bpm): 135    Movement: Present     General:  Alert, oriented and cooperative. Patient is in no acute distress.  Skin: Skin is warm and dry. No rash noted.   Cardiovascular: Normal heart rate noted  Respiratory: Normal respiratory effort, no problems with respiration noted  Abdomen: Soft, gravid, appropriate for gestational age. Pain/Pressure: Present     Pelvic:  Cervical exam deferred        Extremities: Normal range of motion.  Edema: None  Mental Status: Normal mood and affect. Normal behavior. Normal judgment and thought content.   Urinalysis: Urine Protein: Negative Urine Glucose: Negative Random CBG 88 Assessment and Plan:  Pregnancy: G2P2002 at 8317w1d  There are no diagnoses linked to this encounter. Term labor symptoms and general obstetric precautions including but not limited to vaginal bleeding, contractions, leaking of fluid and fetal movement were reviewed in detail with the patient. Please refer to After Visit Summary for other counseling  recommendations.  Per consult w/ attending will schedule for IOL at 39 weeks for unknown blood sugar control and cancel DM teaching this afternoon.   Dorathy KinsmanVirginia Lauren Aguayo, CNM

## 2016-01-12 NOTE — Patient Instructions (Signed)
Induccin del trabajo de parto  (Labor Induction) Se denomina induccin del trabajo de parto cuando se inician acciones para hacer que una mujer embarazada comience el trabajo de parto. La mayora de las mujeres comienzan el trabajo de parto sin ayuda entre las semanas 37 y 42 del embarazo. Cuando esto no ocurre o cuando hay una necesidad mdica, pueden utilizarse diferentes mtodos para inducirlo. La induccin del trabajo de parto hace que el tero se contraiga. Tambin hace que el cuello del tero se ablandemadure), se abra (se dilate), y se afine (se borre). Generalmente el trabajo de parto no se induce antes de las 39 semanas excepto que haya un problema con el beb o con la madre.  Antes de inducir el trabajo de parto, el mdico considerar cierto nmero de factores incluyendo los siguientes:  El estado del beb.  Cuntas semanas tiene de embarazo.  La madurez de los pulmones del beb.  El estado del cuello del tero.  La posicin del beb. CULES SON LOS MOTIVOS PARA INDUCIR UN PARTO? El trabajo de parto puede inducirse por las siguientes razones:  La salud del beb o de la madre estn en riesgo.  El embarazo se ha pasado de trmino en 1 semana o ms.  Ha roto la bolsa de aguas pero no se ha iniciado el trabajo de parto por s mismo.  La madre tiene algn trastorno de salud o una enfermedad grave, como hipertensin arterial, una infeccin, desprendimiento abrupto de la placenta o diabetes.  Hay escaso lquido amnitico alrededor del beb.  El beb presenta sufrimiento. La conveniencia o el deseo de que el beb nazca en una cierta fecha no es un motivo para inducir el parto. CULES SON LOS MTODOS UTILIZADOS PARA INDUCIR EL TRABAJO DE PARTO? Algunos mtodos de induccin del trabajo de parto son:   Administracin del medicamentos prostaglandina. Este medicamento hace que el cuello uterino se dilate y madure. Este medicamento tambin iniciar las contracciones. Puede tomarse por  boca o insertarse en la vagina en forma de supositorio.  Insercin en la vagina de un tubo delgado (catter) con un baln en el extremo para dilatar el cuello del tero. Una vez insertado, el baln se infla con agua, lo que provoca la apertura del cuello del tero.  Ruptura de las membranas. El mdico separa el saco amnitico del cuello uterino, haciendo que el cuello uterino se distienda y cause la liberacin de la hormona llamada progesterona. Esto hace que el tero se contraiga. Este procedimiento se realiza durante una visita al consultorio mdico. Le indicarn que vuelva a su casa y espere que se inicien las contracciones. Luego tendr que volver para la induccin.  Ruptura de la bolsa de aguas. El mdico romper el saco amnitico con un pequeo instrumento. Una vez que el saco amnitico se rompe, las contracciones deben comenzar. Pueden pasar algunas horas hasta que haga efecto.  Medicamentos que desencadenen o intensifiquen las contracciones. Se lo administrarn a travs de un catter por va intravenosa (IV) que se inserta en una de las venas del brazo. Todos los mtodos de induccin, excepto la ruptura de membranas, se realizan en el hospital. La induccin se realizar en el hospital, de modo que usted y el beb puedan ser controlados cuidadosamente.  CUNTO TIEMPO LLEVA INDUCIR EL TRABAJO DE PARTO? Algunas inducciones pueden demorar entre 2 y 3 das. Generalmente lleva menos tiempo, dependiendo del estado del cuello del tero. Puede tomar ms tiempo si la induccin se realiza en etapas tempranas del embarazo o   es su primer embarazo. Si han pasado 2 o 3 das y no se inicia el trabajo de parto, podrn enviarla a su casa o realizar una cesrea. CULES SON LOS RIESGOS ASOCIADOS CON LA INDUCCiN DEL TRABAJO DE PARTO? Algunos de los riesgos de la induccin son:   Cambios en la frecuencia cardaca fetal, por ejemplo los latidos son demasiado rpidos, o lentos, o errticos.  Riesgo de distrs  fetal.  Posibilidad de infeccin en la madre o el beb.  Aumento de la posibilidad de que sea necesaria una cesrea.  Ruptura (abrupcin) de la placenta del tero (raro).  Ruptura uterina (muy raro). Cuando es necesario realizar la induccin por razones mdicas, los beneficios deben superar a los riesgos. CULES SON ALGUNAS RAZONES PARA NO INDUCIR EL TRABAJO DE PARTO? La induccin no debe realizarse si:   Se demuestra que el beb no tolera el trabajo de parto.  Fue sometida anteriormente a cirugas en el tero, como una miomectoma o le han extirpado fibromas.  La placenta est en una posicin muy baja en el tero y obstruye la abertura del cuello (placenta previa).  El beb no est ubicado con la cabeza hacia bajo.  El cordn umbilical cae hacia el canal de parto, adelante del beb. Esto puede cortar el suministro de sangre y oxgeno al beb.  Fue sometida a una cesrea anteriormente.  Hay circunstancias poco habituales, como que el beb es extremadamente prematuro.   Esta informacin no tiene como fin reemplazar el consejo del mdico. Asegrese de hacerle al mdico cualquier pregunta que tenga.   Document Released: 07/24/2007 Document Revised: 05/07/2014 Elsevier Interactive Patient Education 2016 Elsevier Inc.  

## 2016-01-22 NOTE — Discharge Summary (Signed)
OB Discharge Summary  Patient Name: Anna Green DOB: 1990/06/14 MRN: [id number]  Date of admission: 01/06/2016 Delivering MD: Merril Abbe   Date of discharge: 01/08/2016  Admitting diagnosis: induction Intrauterine pregnancy: [redacted]w[redacted]d     Secondary diagnosis:Active Problems:   Pregnancy  Additional problems:none                              Discharge diagnosis: Term Pregnancy Delivered, GDM A1 and PPH                                                                      Post partum procedures:PPH  Augmentation: Pitocin  Complications: None  Hospital course:  Induction of Labor With Vaginal Delivery   25 y.o. yo [id number] at [redacted]w[redacted]d was admitted to the hospital 01/06/2016 for induction of labor.  Indication for induction: A1 DM.  Patient had an uncomplicated labor course as follows: Membrane Rupture Time/Date: 11:01 PM ,01/06/2016   Intrapartum Procedures: Episiotomy: None [1]                                         Lacerations:  2nd degree [3];Perineal [11]  Patient had delivery of a Viable infant.  Information for the patient's newborn:  Amaka, Gluth [[id number]]  Delivery Method: Vaginal, Spontaneous Delivery (Filed from Delivery Summary)   01/07/2016  Details of delivery can be found in separate delivery note.  Patient had a routine postpartum course. Patient is discharged home 01/08/16.         Physical exam Vitals:   01/07/16 0615 01/07/16 1034 01/07/16 1815 01/08/16 0605  BP: 118/67 117/74 123/74 118/76  Pulse: 87 82 86 81  Resp: 18 20 18 18   Temp: 98.9 F (37.2 C) 98.5 F (36.9 C) 98.7 F (37.1 C) 98.2 F (36.8 C)  TempSrc: Oral Oral Oral Oral  SpO2:   100%   Weight:      Height:       General: alert, cooperative and no distress Lochia: appropriate Uterine Fundus: firm Incision: N/A DVT Evaluation: No evidence of DVT seen on physical exam. Negative Homan's sign. No cords or calf  tenderness. No significant calf/ankle edema. Labs: Recent Labs       Lab Results  Component Value Date   WBC 16.6 (H) 01/07/2016   HGB 11.3 (L) 01/07/2016   HCT 32.1 (L) 01/07/2016   MCV 83.2 01/07/2016   PLT 306 01/07/2016     CMP Latest Ref Rng & Units 08/30/2009  Glucose 70 - 99 mg/dL 95  BUN 6 - 23 mg/dL 8  Creatinine 0.4 - 1.2 mg/dL 1.30  Sodium 865 - 784 mEq/L 136  Potassium 3.5 - 5.1 mEq/L 3.5  Chloride 96 - 112 mEq/L 107  CO2 19 - 32 mEq/L 22  Calcium 8.4 - 10.5 mg/dL 8.9  Total Protein 6.0 - 8.3 g/dL 6.5  Total Bilirubin 0.3 - 1.2 mg/dL 6.9(G)  Alkaline Phos 39 - 117 U/L 147(H)  AST 0 - 37 U/L 20  ALT 0 - 35 U/L 15    Discharge instruction: per After Visit Summary and "  Baby and Me Booklet".    Diet: routine diet  Activity: Advance as tolerated. Pelvic rest for 6 weeks.   Outpatient follow up:6 weeks Follow up Appt:Future Appointments Date Time Provider Department Center  02/16/2016 10:20 AM Marlis EdelsonWalidah N Karim, CNM [facility]   Follow up visit: No Follow-up on file.  Postpartum contraception: IUD Mirena  Newborn Data: Live born female  Birth Weight: 7 lb 0.7 oz (3195 g) APGAR: 7, 9  Baby Feeding: Bottle and Breast Disposition:home with mother   01/08/2016 Renetta ChalkAshley Ellis, Student-MidWife     Revision History

## 2016-02-16 ENCOUNTER — Ambulatory Visit (INDEPENDENT_AMBULATORY_CARE_PROVIDER_SITE_OTHER): Payer: Self-pay | Admitting: Family

## 2016-02-16 MED ORDER — NORGESTIMATE-ETH ESTRADIOL 0.25-35 MG-MCG PO TABS: 1.0000 | ORAL_TABLET | Freq: Every day | ORAL | 11 refills | Status: DC

## 2016-02-16 NOTE — Progress Notes (Signed)
Subjective:     Anna DykeLizbeth Rivera Green is a 25 y.o. female who presents for a postpartum visit. She is 6 weeks postpartum following a spontaneous vaginal delivery. I have fully reviewed the prenatal and intrapartum course. The delivery was at 39 gestational weeks. Outcome: vaginal. Anesthesia: none. Postpartum course has been uncompliated. Baby's course has been uncomplicated . Baby is feeding by breast. Bleeding not at time. Initial bleed lasted 2.5 wks.  Bowel function is normal. Bladder function is normal. Patient is not sexually active. Contraception method is none, desires OCPs. Considering IUD.  Postpartum depression screening: negative.  The following portions of the patient's history were reviewed and updated as appropriate: allergies, current medications, past family history, past medical history, past social history, past surgical history and problem list.  Review of Systems Pertinent items are noted in HPI.   Objective:    BP 135/80   Pulse 77   Wt 182 lb 9.6 oz (82.8 kg)   BMI 32.35 kg/m         General:  alert, cooperative and appears stated age   Breasts:  inspection negative, no nipple discharge or bleeding, no masses or nodularity palpable  Lungs: clear to auscultation bilaterally  Heart:  regular rate and rhythm, S1, S2 normal, no murmur, click, rub or gallop  Abdomen: soft, non-tender; bowel sounds normal; no masses,  no organomegaly  Pelvic exam not indicated - not bleeding, no pain at vaginal area Assessment:     Normal postpartum exam. Pap smear not done at today's visit.   Plan:    1. Contraception: OCP (estrogen/progesterone) 2. Desires to talk to husband about IUD; will reschedule for both 2 hr and IUD placement  Annalynne Ibanez Kennith GainN Karim, CNM

## 2016-03-02 ENCOUNTER — Ambulatory Visit: Payer: Self-pay | Admitting: Obstetrics and Gynecology

## 2017-04-30 ENCOUNTER — Inpatient Hospital Stay (HOSPITAL_COMMUNITY): Payer: Self-pay

## 2017-04-30 ENCOUNTER — Inpatient Hospital Stay (HOSPITAL_COMMUNITY)
Admission: AD | Admit: 2017-04-30 | Discharge: 2017-04-30 | Disposition: A | Discharge status: Home / Self Care | Payer: Self-pay | Source: Ambulatory Visit | Attending: Obstetrics & Gynecology | Admitting: Obstetrics & Gynecology

## 2017-04-30 ENCOUNTER — Encounter (HOSPITAL_COMMUNITY): Payer: Self-pay | Admitting: *Deleted

## 2017-04-30 DIAGNOSIS — O200 Threatened abortion: Secondary | ICD-10-CM

## 2017-04-30 DIAGNOSIS — Z3A13 13 weeks gestation of pregnancy: Secondary | ICD-10-CM | POA: Insufficient documentation

## 2017-04-30 LAB — CBC
HCT: 36.9 % (ref 36.0–46.0)
Hemoglobin: 13.3 g/dL (ref 12.0–15.0)
MCH: 30.7 pg (ref 26.0–34.0)
MCHC: 36 g/dL (ref 30.0–36.0)
MCV: 85.2 fL (ref 78.0–100.0)
Platelets: 312 10*3/uL (ref 150–400)
RBC: 4.33 MIL/uL (ref 3.87–5.11)
RDW: 12.3 % (ref 11.5–15.5)
WBC: 9.3 10*3/uL (ref 4.0–10.5)

## 2017-04-30 LAB — URINALYSIS, ROUTINE W REFLEX MICROSCOPIC

## 2017-04-30 LAB — URINALYSIS, MICROSCOPIC (REFLEX)

## 2017-04-30 LAB — WET PREP, GENITAL
Clue Cells Wet Prep HPF POC: NONE SEEN
Sperm: NONE SEEN
Trich, Wet Prep: NONE SEEN
Yeast Wet Prep HPF POC: NONE SEEN

## 2017-04-30 LAB — POCT PREGNANCY, URINE: PREG TEST UR: POSITIVE — AB

## 2017-04-30 NOTE — Discharge Instructions (Signed)
Threatened Miscarriage °A threatened miscarriage occurs when you have vaginal bleeding during your first 20 weeks of pregnancy but the pregnancy has not ended. If you have vaginal bleeding during this time, your health care provider will do tests to make sure you are still pregnant. If the tests show you are still pregnant and the developing baby (fetus) inside your womb (uterus) is still growing, your condition is considered a threatened miscarriage. °A threatened miscarriage does not mean your pregnancy will end, but it does increase the risk of losing your pregnancy (complete miscarriage). °What are the causes? °The cause of a threatened miscarriage is usually not known. If you go on to have a complete miscarriage, the most common cause is an abnormal number of chromosomes in the developing baby. Chromosomes are the structures inside cells that hold all your genetic material. °Some causes of vaginal bleeding that do not result in miscarriage include: °· Having sex. °· Having an infection. °· Normal hormone changes of pregnancy. °· Bleeding that occurs when an egg implants in your uterus. ° °What increases the risk? °Risk factors for bleeding in early pregnancy include: °· Obesity. °· Smoking. °· Drinking excessive amounts of alcohol or caffeine. °· Recreational drug use. ° °What are the signs or symptoms? °· Light vaginal bleeding. °· Mild abdominal pain or cramps. °How is this diagnosed? °If you have bleeding with or without abdominal pain before 20 weeks of pregnancy, your health care provider will do tests to check whether you are still pregnant. One important test involves using sound waves and a computer (ultrasound) to create images of the inside of your uterus. Other tests include an internal exam of your vagina and uterus (pelvic exam) and measurement of your baby’s heart rate. °You may be diagnosed with a threatened miscarriage if: °· Ultrasound testing shows you are still pregnant. °· Your baby’s heart  rate is strong. °· A pelvic exam shows that the opening between your uterus and your vagina (cervix) is closed. °· Your heart rate and blood pressure are stable. °· Blood tests confirm you are still pregnant. ° °How is this treated? °No treatments have been shown to prevent a threatened miscarriage from going on to a complete miscarriage. However, the right home care is important. °Follow these instructions at home: °· Make sure you keep all your appointments for prenatal care. This is very important. °· Get plenty of rest. °· Do not have sex or use tampons if you have vaginal bleeding. °· Do not douche. °· Do not smoke or use recreational drugs. °· Do not drink alcohol. °· Avoid caffeine. °Contact a health care provider if: °· You have light vaginal bleeding or spotting while pregnant. °· You have abdominal pain or cramping. °· You have a fever. °Get help right away if: °· You have heavy vaginal bleeding. °· You have blood clots coming from your vagina. °· You have severe low back pain or abdominal cramps. °· You have fever, chills, and severe abdominal pain. °This information is not intended to replace advice given to you by your health care provider. Make sure you discuss any questions you have with your health care provider. °Document Released: 04/16/2005 Document Revised: 09/22/2015 Document Reviewed: 02/10/2013 °Elsevier Interactive Patient Education © 2018 Elsevier Inc. ° °

## 2017-04-30 NOTE — MAU Provider Note (Signed)
History     CSN: [id number]  Arrival date & time 04/30/17  0644   None     Chief Complaint  Patient presents with  . Vaginal Bleeding    G3P2002 @ [redacted]w[redacted]d presents to L/D after passing a large blood clot this am. She denies any cramping. Her new OB appointment is tomorrow. She had sex 2 days ago.    History reviewed. No pertinent past medical history.  History reviewed. No pertinent surgical history.  Family History  Problem Relation Age of Onset  . Cancer Father     Social History   Tobacco Use  . Smoking status: Never Smoker  . Smokeless tobacco: Never Used  Substance Use Topics  . Alcohol use: No  . Drug use: No    OB History    Gravida Para Term Preterm AB Living   3 2 2  0 0 2   SAB TAB Ectopic Multiple Live Births   0 0 0 0 1      Review of Systems  Genitourinary: Positive for vaginal bleeding.  All other systems reviewed and are negative.   Allergies  Patient has no known allergies.  Home Medications    BP 140/89 (BP Location: Right Arm)   Pulse 95   Temp 98 F (36.7 C)   Resp 18   Ht 5\' 3"  (1.6 m)   Wt 192 lb (87.1 kg)   LMP 01/26/2017 (Approximate)   SpO2 100%   BMI 34.01 kg/m   Physical Exam  Constitutional: She is oriented to person, place, and time. She appears well-developed and well-nourished.  Cardiovascular: Normal rate.  Pulmonary/Chest: Effort normal. No respiratory distress.  Abdominal: Soft. There is no tenderness.  Genitourinary:  Genitourinary Comments: Cultures and wet prep obtained. Bright red bleeding with small blood clot at os. Pad noted with small amount right red bleeding. VE- cervix outer os open aprox .5cm internal os closed  Musculoskeletal: Normal range of motion.  Neurological: She is alert and oriented to person, place, and time.  Skin: Skin is warm and dry.  Psychiatric: She has a normal mood and affect. Her behavior is normal.  Nursing note and vitals reviewed.   MAU Course  Procedures (including  critical care time)  Labs Reviewed  URINALYSIS, ROUTINE W REFLEX MICROSCOPIC - Abnormal; Notable for the following components:      Result Value   Color, Urine RED (*)    APPearance TURBID (*)    Glucose, UA   (*)    Value: TEST NOT REPORTED DUE TO COLOR INTERFERENCE OF URINE PIGMENT   Hgb urine dipstick   (*)    Value: TEST NOT REPORTED DUE TO COLOR INTERFERENCE OF URINE PIGMENT   Bilirubin Urine   (*)    Value: TEST NOT REPORTED DUE TO COLOR INTERFERENCE OF URINE PIGMENT   Ketones, ur   (*)    Value: TEST NOT REPORTED DUE TO COLOR INTERFERENCE OF URINE PIGMENT   Protein, ur   (*)    Value: TEST NOT REPORTED DUE TO COLOR INTERFERENCE OF URINE PIGMENT   Nitrite   (*)    Value: TEST NOT REPORTED DUE TO COLOR INTERFERENCE OF URINE PIGMENT   Leukocytes, UA   (*)    Value: TEST NOT REPORTED DUE TO COLOR INTERFERENCE OF URINE PIGMENT   All other components within normal limits  URINALYSIS, MICROSCOPIC (REFLEX) - Abnormal; Notable for the following components:   Bacteria, UA FEW (*)    Squamous Epithelial / LPF 0-5 (*)  All other components within normal limits  POCT PREGNANCY, URINE - Abnormal; Notable for the following components:   Preg Test, Ur POSITIVE (*)    All other components within normal limits  WET PREP, GENITAL  CBC  GC/CHLAMYDIA PROBE AMP ([place] NOT [facility]   [place] Ob Comp Less 14 Wks  Result Date: 04/30/2017 CLINICAL DATA:  27 year old pregnant female presents with vaginal bleeding. EDC by LMP: 11/02/2017, projecting to an expected gestational age of [redacted] weeks 3 days. EXAM: OBSTETRIC <14 WK ULTRASOUND TECHNIQUE: Transabdominal ultrasound was performed for evaluation of the gestation as well as the maternal uterus and adnexal regions. COMPARISON:  No prior scans from this gestation. FINDINGS: Intrauterine gestational sac: Single intrauterine gestational sac appears normal in size, shape and position. Yolk sac:  Not Visualized. Fetus:  Visualized. Fetal Cardiac Activity:  Regular rate and rhythm. Fetal Heart Rate: 151 bpm CRL:   66.8  mm   12 w 6 d                  US EDC: 11/06/2017 Subchorionic hemorrhage:  None visualized. Maternal uterus/adnexae: Left ovary measures 1.9 x 1.4 x 2.1 cm and contains a corpus luteal cyst. Right ovary measures 3.3 x 1.8 x 1.8 cm. No abnormal ovarian or adnexal masses. No uterine fibroids. No abnormal free fluid in the pelvis. IMPRESSION: 1. Single living intrauterine gestation at 12 weeks 6 days by crown-rump length, concordant with provided menstrual dating . 2. No acute first-trimester gestational abnormality. Normal fetal cardiac activity. Electronically Signed   By: Delbert PhenixJason A Poff M.D.   On: 04/30/2017 08:41    Blood typre- B positive No diagnosis found.    MDM  Threatened Miscarriage in 2nd trimester SAB and bleeding precautions DC home Return to office tomorrow for regular scheduled appointment Pelvic Rest

## 2017-04-30 NOTE — L&D Delivery Note (Signed)
Asked to attend delivery due to complete breech and 3rd baby. Offered vaginal breech delivery. Risks reviewed. Mom pushed with 3-4 contractions and delivered frank breech female infant. Infant delivered to mom's chest without difficulty. Spontaneous cry heard. Dr. Mora ApplPinn present and completed the delivery. Please see her notes for further details.

## 2017-04-30 NOTE — L&D Delivery Note (Signed)
Delivery Note Mother was noted to be complete breech once she was C/C/+2 station.  Discussed risks with mother of a vaginal breech delivery to include entrapment Of the after-coming head. We also discussed primary cesarean section for Fetal breech presentation. After discussion she agreed to proceed with vaginal  Delivery.   Dr. Tinnie Gensanya Pratt attended and delivered a live healthy female at 4:43 PM via Vaginal breech delivery. Presentation (Presentation: Anterior Sacrum).  APGAR:9/9 weight pending. Baby  Laid on maternal abdomen and was noted to have vigorous cry.   Neonatal team was in the room for delivery.  Delayed cord clamping was done. Cord cut by father. Placenta spontaneously Delivered intact 3 vessels noted.    Uterine atony was alleviated with massage and IV pitocin.  No lacerations were noted.  Patient tolerated delivery very well.   Anesthesia:  Epidural Episiotomy:  None Lacerations:  None Suture Repair: n/a Est. Blood Loss (mL):  300  Mom to postpartum.  Baby to Couplet care / Skin to Skin.  Essie HartINN, Ravonda Brecheen STACIA 10/25/2017, 5:01 PM

## 2017-04-30 NOTE — MAU Note (Signed)
Patient woke up this morning and "I thought I peed myself."  Went to the bathroom and noticed bright red blood when wiping, then past a golf ball sized blood clot.  Continues to have some bleeding. Denies pain.

## 2017-05-01 LAB — GC/CHLAMYDIA PROBE AMP (~~LOC~~) NOT AT ARMC
Chlamydia: NEGATIVE
Neisseria Gonorrhea: NEGATIVE

## 2017-05-02 DIAGNOSIS — Z8632 Personal history of gestational diabetes: Secondary | ICD-10-CM | POA: Insufficient documentation

## 2017-10-10 LAB — OB RESULTS CONSOLE GBS: STREP GROUP B AG: NEGATIVE

## 2017-10-24 ENCOUNTER — Other Ambulatory Visit: Payer: Self-pay

## 2017-10-24 ENCOUNTER — Encounter (HOSPITAL_COMMUNITY): Payer: Self-pay | Admitting: *Deleted

## 2017-10-24 ENCOUNTER — Inpatient Hospital Stay (HOSPITAL_COMMUNITY)
Admission: AD | Admit: 2017-10-24 | Discharge: 2017-10-26 | DRG: 807 | Disposition: A | Discharge status: Home / Self Care | Payer: BLUE CROSS/BLUE SHIELD | Attending: Obstetrics & Gynecology | Admitting: Obstetrics & Gynecology

## 2017-10-24 DIAGNOSIS — D649 Anemia, unspecified: Secondary | ICD-10-CM | POA: Diagnosis present

## 2017-10-24 DIAGNOSIS — O9902 Anemia complicating childbirth: Secondary | ICD-10-CM | POA: Diagnosis present

## 2017-10-24 DIAGNOSIS — O1494 Unspecified pre-eclampsia, complicating childbirth: Principal | ICD-10-CM | POA: Diagnosis present

## 2017-10-24 DIAGNOSIS — O321XX0 Maternal care for breech presentation, not applicable or unspecified: Secondary | ICD-10-CM | POA: Diagnosis present

## 2017-10-24 DIAGNOSIS — Z3A38 38 weeks gestation of pregnancy: Secondary | ICD-10-CM | POA: Diagnosis not present

## 2017-10-24 HISTORY — DX: Other specified health status: Z78.9

## 2017-10-24 LAB — COMPREHENSIVE METABOLIC PANEL
ALT: 32 U/L (ref 0–44)
AST: 56 U/L — ABNORMAL HIGH (ref 15–41)
Albumin: 3.1 g/dL — ABNORMAL LOW (ref 3.5–5.0)
Alkaline Phosphatase: 148 U/L — ABNORMAL HIGH (ref 38–126)
Anion gap: 12 (ref 5–15)
BUN: 8 mg/dL (ref 6–20)
CO2: 18 mmol/L — ABNORMAL LOW (ref 22–32)
Calcium: 9 mg/dL (ref 8.9–10.3)
Chloride: 104 mmol/L (ref 98–111)
Creatinine, Ser: 0.48 mg/dL (ref 0.44–1.00)
GFR calc Af Amer: >60 mL/min (ref >60)
GFR calc non Af Amer: >60 mL/min (ref >60)
Glucose, Bld: 96 mg/dL (ref 70–99)
Potassium: 3.6 mmol/L (ref 3.5–5.1)
Sodium: 134 mmol/L — ABNORMAL LOW (ref 135–145)
Total Bilirubin: 0.6 mg/dL (ref 0.3–1.2)
Total Protein: 6.7 g/dL (ref 6.5–8.1)

## 2017-10-24 LAB — PROTEIN / CREATININE RATIO, URINE
Creatinine, Urine: 96 mg/dL
Protein Creatinine Ratio: 0.3 mg/mg{Cre} — ABNORMAL HIGH (ref 0.00–0.15)
Total Protein, Urine: 29 mg/dL

## 2017-10-24 LAB — TYPE AND SCREEN
ABO/RH(D): B POS
Antibody Screen: NEGATIVE

## 2017-10-24 LAB — CBC
HCT: 33.2 % — ABNORMAL LOW (ref 36.0–46.0)
Hemoglobin: 11 g/dL — ABNORMAL LOW (ref 12.0–15.0)
MCH: 27.6 pg (ref 26.0–34.0)
MCHC: 33.1 g/dL (ref 30.0–36.0)
MCV: 83.2 fL (ref 78.0–100.0)
Platelets: 369 10*3/uL (ref 150–400)
RBC: 3.99 MIL/uL (ref 3.87–5.11)
RDW: 13.9 % (ref 11.5–15.5)
WBC: 9.4 10*3/uL (ref 4.0–10.5)

## 2017-10-24 MED ORDER — LABETALOL HCL 5 MG/ML IV SOLN: 20.0000 mg | INTRAVENOUS | Status: DC | PRN

## 2017-10-24 MED ORDER — SOD CITRATE-CITRIC ACID 500-334 MG/5ML PO SOLN
30.0000 mL | ORAL | Status: DC | PRN
  Filled 2017-10-24: qty 15

## 2017-10-24 MED ORDER — TERBUTALINE SULFATE 1 MG/ML IJ SOLN
0.2500 mg | Freq: Once | INTRAMUSCULAR | Status: DC | PRN
  Filled 2017-10-24: qty 1

## 2017-10-24 MED ORDER — LACTATED RINGERS IV SOLN: 500.0000 mL | INTRAVENOUS | Status: DC | PRN

## 2017-10-24 MED ORDER — HYDRALAZINE HCL 20 MG/ML IJ SOLN: 10.0000 mg | Freq: Once | INTRAMUSCULAR | Status: DC | PRN

## 2017-10-24 MED ORDER — ACETAMINOPHEN 325 MG PO TABS: 650.0000 mg | ORAL_TABLET | ORAL | Status: DC | PRN

## 2017-10-24 MED ORDER — TERBUTALINE SULFATE 1 MG/ML IJ SOLN: 0.2500 mg | Freq: Once | INTRAMUSCULAR | Status: DC | PRN

## 2017-10-24 MED ORDER — OXYTOCIN 40 UNITS IN LACTATED RINGERS INFUSION - SIMPLE MED: 2.5000 [IU]/h | INTRAVENOUS | Status: DC

## 2017-10-24 MED ORDER — LACTATED RINGERS IV SOLN
INTRAVENOUS | Status: DC
  Administered 2017-10-24: 18:00:00 via INTRAVENOUS

## 2017-10-24 MED ORDER — OXYCODONE-ACETAMINOPHEN 5-325 MG PO TABS: 2.0000 | ORAL_TABLET | ORAL | Status: DC | PRN

## 2017-10-24 MED ORDER — ONDANSETRON HCL 4 MG/2ML IJ SOLN: 4.0000 mg | Freq: Four times a day (QID) | INTRAMUSCULAR | Status: DC | PRN

## 2017-10-24 MED ORDER — OXYCODONE-ACETAMINOPHEN 5-325 MG PO TABS
1.0000 | ORAL_TABLET | ORAL | Status: DC | PRN
  Administered 2017-10-26 (×2): 1 via ORAL
  Filled 2017-10-24 (×2): qty 1

## 2017-10-24 MED ORDER — FENTANYL CITRATE (PF) 100 MCG/2ML IJ SOLN: 50.0000 ug | INTRAMUSCULAR | Status: DC | PRN

## 2017-10-24 MED ORDER — OXYTOCIN BOLUS FROM INFUSION
500.0000 mL | Freq: Once | INTRAVENOUS | Status: AC
  Administered 2017-10-25: 500 mL via INTRAVENOUS

## 2017-10-24 MED ORDER — MISOPROSTOL 25 MCG QUARTER TABLET
25.0000 ug | ORAL_TABLET | ORAL | Status: DC | PRN
  Administered 2017-10-24 (×2): 25 ug via VAGINAL
  Filled 2017-10-24 (×3): qty 1

## 2017-10-24 MED ORDER — LIDOCAINE HCL (PF) 1 % IJ SOLN
30.0000 mL | INTRAMUSCULAR | Status: DC | PRN
  Filled 2017-10-24: qty 30

## 2017-10-24 MED ORDER — ACETAMINOPHEN 325 MG PO TABS
650.0000 mg | ORAL_TABLET | Freq: Once | ORAL | Status: AC
  Administered 2017-10-24: 650 mg via ORAL
  Filled 2017-10-24: qty 2

## 2017-10-24 NOTE — MAU Note (Signed)
Pt sent from office for HTN, also presents with a headache 3/10. No bleeding or LOF +FM

## 2017-10-24 NOTE — Progress Notes (Signed)
Anna Green is a 27 y.o. G3P2002 at 5738w5d admitted for induction of labor due to Pre-eclamptic toxemia of pregnancy without severe features.   Subjective: Patient no longer feeling headache, tylenol resolved. Discussed plan of care, Cytotec then Pitocin IOL.   Objective: Vitals:   10/24/17 1645 10/24/17 1700 10/24/17 1715 10/24/17 1730  BP: 138/90 139/81 137/87 (!) 147/97  Pulse: 94 (!) 103 96 91  Resp:      Temp:      TempSrc:      SpO2:      Weight:        FHT:  FHR: 135 bpm, variability: moderate,  accelerations:  Present,  decelerations:  Absent UC:   Had a run of contractions previously but none now on monitor  SVE:   Dilation: Fingertip Exam by:: clemmons  Labs: Lab Results  Component Value Date   WBC 9.4 10/24/2017   HGB 11.0 (L) 10/24/2017   HCT 33.2 (L) 10/24/2017   MCV 83.2 10/24/2017   PLT 369 10/24/2017    Assessment / Plan: Induction of labor due to preeclampsia without severe features, progressing well on cytotec  Postpartum magnesium sulfate  Labor: Progressing normally Preeclampsia:  no signs or symptoms of toxicity, intake and ouput balanced and labs stable Fetal Wellbeing:  Category I Pain Control:  Epidural and IV pain meds I/D:  n/a Anticipated MOD:  NSVD  Anna Green 10/24/2017, 7:12 PM

## 2017-10-24 NOTE — H&P (Signed)
Anna Green 27 y.o. [id number] @ [redacted]w[redacted]d presents for Preeclampsia work up. She is complaining of a headache that she rates as a 3  OB History    Gravida  3   Para  2   Term  2   Preterm  0   AB  0   Living  2     SAB  0   TAB  0   Ectopic  0   Multiple  0   Live Births  1          Past Medical History:  Diagnosis Date  . Medical history non-contributory    Past Surgical History:  Procedure Laterality Date  . NO PAST SURGERIES     Family History: family history includes Cancer in her father. Social History:  reports that she has never smoked. She has never used smokeless tobacco. She reports that she does not drink alcohol or use drugs.     Maternal Diabetes: No Genetic Screening:  Maternal Ultrasounds/Referrals: Normal Fetal Ultrasounds or other Referrals:  None Maternal Substance Abuse:  No Significant Maternal Medications:  None Significant Maternal Lab Results:  Lab values include: Group B Strep negative Other Comments:  None  ROS Maternal Medical History:  Reason for admission: Preeclampsia  Fetal activity: Perceived fetal activity is normal.    Prenatal complications: PIH.   Prenatal Complications - Diabetes: none. Had history with 2nd pregnancy    Blood pressure (!) 147/97, pulse 91, temperature 98.4 F (36.9 C), temperature source Oral, resp. rate 18, weight 206 lb (93.4 kg), last menstrual period 01/26/2017, SpO2 100 %, unknown if currently breastfeeding. Maternal Exam:  Abdomen: Patient reports no abdominal tenderness. Estimated fetal weight is 7.   Fetal presentation: vertex  Introitus: Normal vulva. Normal vagina.  Evaluated by Nigel Bridgeman CNM  Pelvis: adequate for delivery.   Cervix: Cervix evaluated by digital exam.     Fetal Exam Fetal Monitor Review: Mode: ultrasound.   Pattern: accelerations present.    Fetal State Assessment: Category I - tracings are normal.     Physical Exam  Nursing note and vitals  reviewed. Constitutional: She is oriented to person, place, and time. She appears well-developed and well-nourished.  HENT:  Head: Normocephalic and atraumatic.  Cardiovascular: Normal rate.  Respiratory: Effort normal and breath sounds normal.  GI: Soft.  Musculoskeletal: Normal range of motion. She exhibits edema.  Neurological: She is alert and oriented to person, place, and time.  Skin: Skin is warm and dry.  Psychiatric: She has a normal mood and affect. Her behavior is normal.    Prenatal labs: ABO, Rh:  B pos Antibody:  Neg Rubella:  Immune RPR:   NR HBsAg:   Neg HIV:   Neg GBS: Negative (06/13 0000)  Results for orders placed or performed during the hospital encounter of 10/24/17 (from the past 24 hour(s))  Protein / creatinine ratio, urine     Status: Abnormal   Collection Time: 10/24/17  4:10 PM  Result Value Ref Range   Creatinine, Urine 96.00 mg/dL   Total Protein, Urine 29 mg/dL   Protein Creatinine Ratio 0.30 (H) 0.00 - 0.15 mg/mg[Cre]  CBC     Status: Abnormal   Collection Time: 10/24/17  4:29 PM  Result Value Ref Range   WBC 9.4 4.0 - 10.5 K/uL   RBC 3.99 3.87 - 5.11 MIL/uL   Hemoglobin 11.0 (L) 12.0 - 15.0 g/dL   HCT 45.4 (L) 09.8 - 11.9 %   MCV 83.2 78.0 -  100.0 fL   MCH 27.6 26.0 - 34.0 pg   MCHC 33.1 30.0 - 36.0 g/dL   RDW 16.113.9 09.611.5 - 04.515.5 %   Platelets 369 150 - 400 K/uL  Comprehensive metabolic panel     Status: Abnormal   Collection Time: 10/24/17  4:29 PM  Result Value Ref Range   Sodium 134 (L) 135 - 145 mmol/L   Potassium 3.6 3.5 - 5.1 mmol/L   Chloride 104 98 - 111 mmol/L   CO2 18 (L) 22 - 32 mmol/L   Glucose, Bld 96 70 - 99 mg/dL   BUN 8 6 - 20 mg/dL   Creatinine, Ser 4.090.48 0.44 - 1.00 mg/dL   Calcium 9.0 8.9 - 81.110.3 mg/dL   Total Protein 6.7 6.5 - 8.1 g/dL   Albumin 3.1 (L) 3.5 - 5.0 g/dL   AST 56 (H) 15 - 41 U/L   ALT 32 0 - 44 U/L   Alkaline Phosphatase 148 (H) 38 - 126 U/L   Total Bilirubin 0.6 0.3 - 1.2 mg/dL   GFR calc non Af  Amer >60 >60 mL/min   GFR calc Af Amer >60 >60 mL/min   Anion gap 12 5 - 15    Assessment/Plan: IUP @ 38+5 Multip Preeclampsia  GBS negative Cervical ripening with cytotec Tylenol for headache : if Gaylyn RongHa does not resolve start Magnesium during Labor process Plan for 24 hours Magnesium postpartum Anticipate Vaginal Delivery  Discussed POC with Dr Sallye OberKulwa and Kathalene FramesEllis Greer to assume care at 700pm. Elmore GuiseLori A Shoshanah Dapper  CNM 10/24/2017, 5:49 PM

## 2017-10-24 NOTE — MAU Note (Signed)
Urine in lab 

## 2017-10-24 NOTE — Anesthesia Pain Management Evaluation Note (Signed)
  CRNA Pain Management Visit Note  Patient: Anna Green, 27 y.o., female  "Hello I am a member of the anesthesia team at [facility]. We have an anesthesia team available at all times to provide care throughout the hospital, including epidural management and anesthesia for C-section. I don't know your plan for the delivery whether it a natural birth, water birth, IV sedation, nitrous supplementation, doula or epidural, but we want to meet your pain goals."   1.Was your pain managed to your expectations on prior hospitalizations?   Yes   2.What is your expectation for pain management during this hospitalization?     Labor support without medications, Epidural and IV pain meds  3.How can we help you reach that goal? *support**  Record the patient's initial score and the patient's pain goal.   Pain: 1  Pain Goal: 8 [facility] wants you to be able to say your pain was always managed very well.  Trellis PaganiniBREWER,Kurstyn Hayward N 10/24/2017

## 2017-10-25 ENCOUNTER — Inpatient Hospital Stay (HOSPITAL_COMMUNITY): Payer: BLUE CROSS/BLUE SHIELD | Admitting: Anesthesiology

## 2017-10-25 ENCOUNTER — Encounter (HOSPITAL_COMMUNITY): Payer: Self-pay

## 2017-10-25 DIAGNOSIS — Z3A38 38 weeks gestation of pregnancy: Secondary | ICD-10-CM

## 2017-10-25 DIAGNOSIS — O321XX0 Maternal care for breech presentation, not applicable or unspecified: Secondary | ICD-10-CM

## 2017-10-25 LAB — CBC
HCT: 33.9 % — ABNORMAL LOW (ref 36.0–46.0)
Hemoglobin: 11.4 g/dL — ABNORMAL LOW (ref 12.0–15.0)
MCH: 27.7 pg (ref 26.0–34.0)
MCHC: 33.6 g/dL (ref 30.0–36.0)
MCV: 82.5 fL (ref 78.0–100.0)
Platelets: 349 10*3/uL (ref 150–400)
RBC: 4.11 MIL/uL (ref 3.87–5.11)
RDW: 13.7 % (ref 11.5–15.5)
WBC: 10.8 10*3/uL — ABNORMAL HIGH (ref 4.0–10.5)

## 2017-10-25 LAB — RPR: RPR Ser Ql: NONREACTIVE

## 2017-10-25 MED ORDER — ONDANSETRON HCL 4 MG/2ML IJ SOLN: 4.0000 mg | INTRAMUSCULAR | Status: DC | PRN

## 2017-10-25 MED ORDER — ONDANSETRON HCL 4 MG PO TABS: 4.0000 mg | ORAL_TABLET | ORAL | Status: DC | PRN

## 2017-10-25 MED ORDER — WITCH HAZEL-GLYCERIN EX PADS
1.0000 | MEDICATED_PAD | CUTANEOUS | Status: DC | PRN
Start: 2017-10-25 — End: 2017-10-26

## 2017-10-25 MED ORDER — ZOLPIDEM TARTRATE 5 MG PO TABS
5.0000 mg | ORAL_TABLET | Freq: Every evening | ORAL | Status: DC | PRN
  Administered 2017-10-25: 5 mg via ORAL
  Filled 2017-10-25: qty 1

## 2017-10-25 MED ORDER — FENTANYL 2.5 MCG/ML BUPIVACAINE 1/10 % EPIDURAL INFUSION (WH - ANES)
14.0000 mL/h | INTRAMUSCULAR | Status: DC | PRN
  Administered 2017-10-25: 14 mL/h via EPIDURAL
  Filled 2017-10-25: qty 100

## 2017-10-25 MED ORDER — DIBUCAINE 1 % RE OINT: 1.0000 "application " | TOPICAL_OINTMENT | RECTAL | Status: DC | PRN

## 2017-10-25 MED ORDER — EPHEDRINE 5 MG/ML INJ
10.0000 mg | INTRAVENOUS | Status: DC | PRN
  Filled 2017-10-25: qty 2

## 2017-10-25 MED ORDER — PHENYLEPHRINE 40 MCG/ML (10ML) SYRINGE FOR IV PUSH (FOR BLOOD PRESSURE SUPPORT)
80.0000 ug | PREFILLED_SYRINGE | INTRAVENOUS | Status: DC | PRN
  Filled 2017-10-25: qty 5

## 2017-10-25 MED ORDER — ZOLPIDEM TARTRATE 5 MG PO TABS: 5.0000 mg | ORAL_TABLET | Freq: Every evening | ORAL | Status: DC | PRN

## 2017-10-25 MED ORDER — DIPHENHYDRAMINE HCL 50 MG/ML IJ SOLN: 12.5000 mg | INTRAMUSCULAR | Status: DC | PRN

## 2017-10-25 MED ORDER — OXYTOCIN 40 UNITS IN LACTATED RINGERS INFUSION - SIMPLE MED
1.0000 m[IU]/min | INTRAVENOUS | Status: DC
  Administered 2017-10-25: 1 m[IU]/min via INTRAVENOUS
  Filled 2017-10-25: qty 1000

## 2017-10-25 MED ORDER — DIPHENHYDRAMINE HCL 25 MG PO CAPS
25.0000 mg | ORAL_CAPSULE | Freq: Four times a day (QID) | ORAL | Status: DC | PRN
  Administered 2017-10-26: 25 mg via ORAL
  Filled 2017-10-25: qty 1

## 2017-10-25 MED ORDER — TETANUS-DIPHTH-ACELL PERTUSSIS 5-2.5-18.5 LF-MCG/0.5 IM SUSP: 0.5000 mL | Freq: Once | INTRAMUSCULAR | Status: DC

## 2017-10-25 MED ORDER — BENZOCAINE-MENTHOL 20-0.5 % EX AERO
1.0000 | INHALATION_SPRAY | CUTANEOUS | Status: DC | PRN
Start: 2017-10-25 — End: 2017-10-26

## 2017-10-25 MED ORDER — IBUPROFEN 600 MG PO TABS
600.0000 mg | ORAL_TABLET | Freq: Four times a day (QID) | ORAL | Status: DC
  Administered 2017-10-25 – 2017-10-26 (×4): 600 mg via ORAL
  Filled 2017-10-25 (×5): qty 1

## 2017-10-25 MED ORDER — PHENYLEPHRINE 40 MCG/ML (10ML) SYRINGE FOR IV PUSH (FOR BLOOD PRESSURE SUPPORT)
80.0000 ug | PREFILLED_SYRINGE | INTRAVENOUS | Status: DC | PRN
  Filled 2017-10-25: qty 10
  Filled 2017-10-25: qty 5

## 2017-10-25 MED ORDER — ACETAMINOPHEN 325 MG PO TABS: 650.0000 mg | ORAL_TABLET | ORAL | Status: DC | PRN

## 2017-10-25 MED ORDER — COCONUT OIL OIL: 1.0000 "application " | TOPICAL_OIL | Status: DC | PRN

## 2017-10-25 MED ORDER — SENNOSIDES-DOCUSATE SODIUM 8.6-50 MG PO TABS
2.0000 | ORAL_TABLET | ORAL | Status: DC
  Administered 2017-10-25: 2 via ORAL
  Filled 2017-10-25: qty 2

## 2017-10-25 MED ORDER — SIMETHICONE 80 MG PO CHEW: 80.0000 mg | CHEWABLE_TABLET | ORAL | Status: DC | PRN

## 2017-10-25 MED ORDER — PRENATAL MULTIVITAMIN CH
1.0000 | ORAL_TABLET | Freq: Every day | ORAL | Status: DC
  Filled 2017-10-25: qty 1

## 2017-10-25 MED ORDER — TERBUTALINE SULFATE 1 MG/ML IJ SOLN
0.2500 mg | Freq: Once | INTRAMUSCULAR | Status: DC | PRN
  Filled 2017-10-25: qty 1

## 2017-10-25 MED ORDER — LIDOCAINE HCL (PF) 1 % IJ SOLN
INTRAMUSCULAR | Status: DC | PRN
  Administered 2017-10-25 (×2): 5 mL via EPIDURAL

## 2017-10-25 MED ORDER — LACTATED RINGERS IV SOLN: 500.0000 mL | Freq: Once | INTRAVENOUS | Status: DC

## 2017-10-25 NOTE — Progress Notes (Signed)
Anna BoschLizbeth Green is a 27 y.o. G3P2002 at 5173w5d admitted for induction of labor due to Pre-eclamptic toxemia of pregnancy without severe features.   Subjective: Pt awake in bed with family at bedside. Pt feeling contraction but tolerates them well. Pt stated her plans if to still  try IV pain medication first as needed then epidural. Dr Mora ApplPinn talked with pt about risk and benefit of rupturing her water, and discussed the option for the pt to receive an epidural before that was down.Pt denies HA, vision changes, no RUQ pain.  Objective: Vitals:   10/25/17 0830 10/25/17 0900 10/25/17 0930 10/25/17 1000  BP: 128/76 (!) 157/95 136/82 (!) 128/95  Pulse: 95 93 92 83  Resp: 18 18 18 16   Temp:      TempSrc:      SpO2:      Weight:      Height:        FHT:  FHR: 135 bpm, variability: moderate,  accelerations:  Present,  decelerations:  Absent UC:   Irregular, 3-4 minutes apart  SVE:   Dilation: 4 Effacement (%): 60 Station: -3 Exam by:: J. Dameion Briles, CNM(Dr. Mora ApplPinn at bedside) Pit: 8mu  Labs: Lab Results  Component Value Date   WBC 9.4 10/24/2017   HGB 11.0 (L) 10/24/2017   HCT 33.2 (L) 10/24/2017   MCV 83.2 10/24/2017   PLT 369 10/24/2017    Assessment / Plan: Induction of labor due to preeclampsia without severe features, progressing with pitocin   Labor: Progressing normally, .  Preeclampsia:  no signs or symptoms of toxicity, intake and ouput balanced and labs stable Fetal Wellbeing:  Category I Pain Control:  Epidural and IV pain meds Augmention: Continue pitocin drip, now at 8, continue to watch labor progression, will AROM and place IUPC post epidural placement Anticipated MOD:  NSVD   Will talk with Dr Mora ApplPinn about Postpartum magnesium sulfate.  Dr. Mora ApplPinn was in room for assessment and aware of plan of care.   Anna Green 10/25/2017, 10:57 AM

## 2017-10-25 NOTE — Anesthesia Procedure Notes (Signed)
Epidural Patient location during procedure: OB Start time: 10/25/2017 12:04 PM End time: 10/25/2017 12:14 PM  Staffing Anesthesiologist: Leonides GrillsEllender, Ryan P, MD Performed: anesthesiologist   Preanesthetic Checklist Completed: patient identified, site marked, pre-op evaluation, timeout performed, IV checked, risks and benefits discussed and monitors and equipment checked  Epidural Patient position: sitting Prep: DuraPrep Patient monitoring: heart rate, cardiac monitor, continuous pulse ox and blood pressure Approach: midline Location: L4-L5 Injection technique: LOR air  Needle:  Needle type: Tuohy  Needle gauge: 17 G Needle length: 9 cm Needle insertion depth: 7 cm Catheter type: closed end flexible Catheter size: 19 Gauge Catheter at skin depth: 12 cm Test dose: negative and Other  Assessment Events: blood not aspirated, injection not painful, no injection resistance and negative IV test  Additional Notes Informed consent obtained prior to proceeding including risk of failure, 1% risk of PDPH, risk of minor discomfort and bruising. Discussed alternatives to epidural analgesia and patient desires to proceed.  Timeout performed pre-procedure verifying patient name, procedure, and platelet count.  Patient tolerated procedure well. Reason for block:procedure for pain

## 2017-10-25 NOTE — Progress Notes (Signed)
[name] is a 27 y.o. G3P2002 at 6428w5d admitted for induction of labor due to Pre-eclamptic toxemia of pregnancy without severe features.   Subjective: Pt awake in bed with family at bedside. Pt had an epidural placed and tolerated it well. Pt endorses still able to move legs and feels the foley cathter. Pt denies HA, vision changes, no RUQ pain.  Objective: Vitals:   10/25/17 1240 10/25/17 1245 10/25/17 1250 10/25/17 1300  BP: 131/90 132/84 136/89 134/87  Pulse: 92 89 84 90  Resp: 18  16 16   Temp:      TempSrc:      SpO2:      Weight:      Height:        FHT:  FHR: 135 bpm, variability: moderate,  accelerations:  Present,  decelerations:  Absent UC:   Irregular, 3-4 minutes apart  SVE:   Dilation: 6 Effacement (%): 90 Station: -1 Exam by:: Dr. Roxy HorsemanPinn Pit: 9mu AROM: Clear, infant tolerated well, no decels.   Labs: Lab Results  Component Value Date   WBC 10.8 (H) 10/25/2017   HGB 11.4 (L) 10/25/2017   HCT 33.9 (L) 10/25/2017   MCV 82.5 10/25/2017   PLT 349 10/25/2017    Assessment / Plan: Induction of labor due to preeclampsia without severe features, progressing with pitocin, AROM cleared fluids, pt left on right side with peanut ball.   Labor: Progressing normally, .  Preeclampsia:  no signs or symptoms of toxicity, intake and ouput balanced and labs stable Fetal Wellbeing:  Category I Pain Control:  Epidural placed Augmention: Continue pitocin drip, now at 9, AROM at 1330  Anticipated MOD:  NSVD   Will talk with Dr Mora ApplPinn about Postpartum magnesium sulfate.  Dr. Mora ApplPinn was in room for assessment and aware of plan of care.   Anna Green 10/25/2017, 1:40 PM

## 2017-10-25 NOTE — Anesthesia Preprocedure Evaluation (Signed)
Anesthesia Evaluation  Patient identified by MRN, date of birth, ID band Patient awake    Reviewed: Allergy & Precautions, H&P , NPO status , Patient's Chart, lab work & pertinent test results  History of Anesthesia Complications Negative for: history of anesthetic complications  Airway Mallampati: II  TM Distance: >3 FB Neck ROM: full    Dental no notable dental hx. (+) Teeth Intact   Pulmonary neg pulmonary ROS,    Pulmonary exam normal breath sounds clear to auscultation       Cardiovascular negative cardio ROS Normal cardiovascular exam Rhythm:regular Rate:Normal     Neuro/Psych negative neurological ROS  negative psych ROS   GI/Hepatic negative GI ROS, Neg liver ROS,   Endo/Other  negative endocrine ROS  Renal/GU negative Renal ROS  negative genitourinary   Musculoskeletal   Abdominal (+) + obese,   Peds  Hematology  (+) anemia ,   Anesthesia Other Findings   Reproductive/Obstetrics (+) Pregnancy                             Anesthesia Physical Anesthesia Plan  ASA: II  Anesthesia Plan: Epidural   Post-op Pain Management:    Induction:   PONV Risk Score and Plan:   Airway Management Planned:   Additional Equipment:   Intra-op Plan:   Post-operative Plan:   Informed Consent: I have reviewed the patients History and Physical, chart, labs and discussed the procedure including the risks, benefits and alternatives for the proposed anesthesia with the patient or authorized representative who has indicated his/her understanding and acceptance.     Plan Discussed with:   Anesthesia Plan Comments:         Anesthesia Quick Evaluation  

## 2017-10-25 NOTE — Progress Notes (Signed)
Anna Green is a 27 y.o. G3P2002 at 1062w5d admitted for induction of labor due to Pre-eclamptic toxemia of pregnancy without severe features.   Subjective: Patient feeling well. Was feeling some contractions earlier but they've spaced out and now she is only feeling contractions rarely.   Objective: Vitals:   10/24/17 2156 10/24/17 2244 10/24/17 2352 10/25/17 0000  BP: 132/75 123/68 126/70 121/73  Pulse: 94 90 89 92  Resp: 16 16 16 16   Temp:   98.2 F (36.8 C)   TempSrc:   Oral   SpO2:      Weight:        FHT:  FHR: 145 bpm, variability: moderate,  accelerations:  Present,  decelerations:  Absent UC:   Had some regular contractions previously but none now on monitor  SVE:   Dilation: 1 Effacement (%): 50 Exam by:: Anna Green, CNM  Labs: Lab Results  Component Value Date   WBC 9.4 10/24/2017   HGB 11.0 (L) 10/24/2017   HCT 33.2 (L) 10/24/2017   MCV 83.2 10/24/2017   PLT 369 10/24/2017    Assessment / Plan: Induction of labor due to preeclampsia without severe features, progressing well on cytotec  Postpartum magnesium sulfate  Labor: Progressing normally Preeclampsia:  no signs or symptoms of toxicity, intake and ouput balanced and labs stable Fetal Wellbeing:  Category I Pain Control:  Epidural and IV pain meds I/D:  n/a Anticipated MOD:  NSVD  Anna Green 10/25/2017, 12:11 AM

## 2017-10-25 NOTE — Progress Notes (Signed)
Anna BoschLizbeth Green is a 27 y.o. G3P2002 at 2422w5d admitted for induction of labor due to Pre-eclamptic toxemia of pregnancy without severe features.   Subjective: Patient asleep. Discussed starting pitocin. Patient desires to try IV pain medication first as needed then epidural.   Objective: Vitals:   10/24/17 2352 10/25/17 0000 10/25/17 0100 10/25/17 0309  BP: 126/70 121/73 125/78 137/88  Pulse: 89 92 85 89  Resp: 16 16 16 16   Temp: 98.2 F (36.8 C)     TempSrc: Oral     SpO2:      Weight:        FHT:  FHR: 135 bpm, variability: moderate,  accelerations:  Present,  decelerations:  Absent UC:   Irregular, 3-4 minutes apart  SVE:   Dilation: 1.5 Effacement (%): 50 Station: -2 Exam by:: Kathalene FramesEllis Jiyan Walkowski, CNM  Labs: Lab Results  Component Value Date   WBC 9.4 10/24/2017   HGB 11.0 (L) 10/24/2017   HCT 33.2 (L) 10/24/2017   MCV 83.2 10/24/2017   PLT 369 10/24/2017    Assessment / Plan: Induction of labor due to preeclampsia without severe features, progressing well on cytotec  Postpartum magnesium sulfate  Labor: Progressing normally Preeclampsia:  no signs or symptoms of toxicity, intake and ouput balanced and labs stable Fetal Wellbeing:  Category I Pain Control:  Epidural and IV pain meds I/D:  n/a Anticipated MOD:  NSVD  Janeece Riggersllis K Judie Hollick 10/25/2017, 3:23 AM

## 2017-10-26 LAB — COMPREHENSIVE METABOLIC PANEL
ALBUMIN: 2.5 g/dL — AB (ref 3.5–5.0)
ALT: 28 U/L (ref 0–44)
ANION GAP: 10 (ref 5–15)
AST: 57 U/L — ABNORMAL HIGH (ref 15–41)
Alkaline Phosphatase: 124 U/L (ref 38–126)
BUN: 10 mg/dL (ref 6–20)
CHLORIDE: 105 mmol/L (ref 98–111)
CO2: 19 mmol/L — AB (ref 22–32)
Calcium: 8.7 mg/dL — ABNORMAL LOW (ref 8.9–10.3)
Creatinine, Ser: 0.55 mg/dL (ref 0.44–1.00)
GFR calc Af Amer: >60 mL/min (ref >60)
GFR calc non Af Amer: >60 mL/min (ref >60)
GLUCOSE: 114 mg/dL — AB (ref 70–99)
POTASSIUM: 3.7 mmol/L (ref 3.5–5.1)
SODIUM: 134 mmol/L — AB (ref 135–145)
Total Bilirubin: 0.6 mg/dL (ref 0.3–1.2)
Total Protein: 5.7 g/dL — ABNORMAL LOW (ref 6.5–8.1)

## 2017-10-26 LAB — CBC
HCT: 30.5 % — ABNORMAL LOW (ref 36.0–46.0)
HEMOGLOBIN: 10.3 g/dL — AB (ref 12.0–15.0)
MCH: 27.9 pg (ref 26.0–34.0)
MCHC: 33.8 g/dL (ref 30.0–36.0)
MCV: 82.7 fL (ref 78.0–100.0)
PLATELETS: 325 10*3/uL (ref 150–400)
RBC: 3.69 MIL/uL — ABNORMAL LOW (ref 3.87–5.11)
RDW: 13.9 % (ref 11.5–15.5)
WBC: 10.9 10*3/uL — ABNORMAL HIGH (ref 4.0–10.5)

## 2017-10-26 LAB — URIC ACID: Uric Acid, Serum: 4.7 mg/dL (ref 2.5–7.1)

## 2017-10-26 MED ORDER — IBUPROFEN 600 MG PO TABS: 600.0000 mg | ORAL_TABLET | Freq: Four times a day (QID) | ORAL | 0 refills | Status: AC

## 2017-10-26 MED ORDER — OXYCODONE-ACETAMINOPHEN 5-325 MG PO TABS: 1.0000 | ORAL_TABLET | ORAL | 0 refills | Status: AC | PRN

## 2017-10-26 NOTE — Discharge Summary (Signed)
Obstetric Discharge Summary Reason for Admission: induction of labor for preeclampsia  Prenatal Procedures: NST and Preeclampsia Intrapartum Procedures: vaginal breech delivery Postpartum Procedures: none Complications-Operative and Postpartum: none Hemoglobin  Date Value Ref Range Status  10/26/2017 10.3 (L) 12.0 - 15.0 g/dL Final   HCT  Date Value Ref Range Status  10/26/2017 30.5 (L) 36.0 - 46.0 % Final    Physical Exam:  General: alert, cooperative and no distress Lochia: appropriate Uterine Fundus: firm Neuro: 2+ DTRs DVT Evaluation: No evidence of DVT seen on physical exam. Negative Homan's sign. No cords or calf tenderness.  Discharge Diagnoses: Term Pregnancy-delivered, Preelampsia and breech delivery  Discharge Information: Date: 10/26/2017 Activity: pelvic rest Diet: routine Medications: PNV, Ibuprofen and Percocet Condition: stable Instructions: refer to practice specific booklet Discharge to: home   Newborn Data: Live born female  Birth Weight: 7 lb 1.9 oz (3230 g) APGAR: 9, 9  Newborn Delivery   Birth date/time:  10/25/2017 16:43:00 Delivery type:  Vaginal, Breech Breech:  Spontaneous     Home with mother.  Essie HartINN, Alden Feagan STACIA 10/26/2017, 2:57 PM

## 2017-10-26 NOTE — Plan of Care (Signed)
Pt ambulating and incorporating care for newborn adequately.

## 2017-10-26 NOTE — Progress Notes (Addendum)
Subjective: Postpartum Day # 2 : S/P NSVD due to IOL: for PreE without severe features, breech delivery. Patient up ad lib, denies syncope or dizziness. Reports consuming regular diet without issues and denies N/V. Patient reports 0 bowel movement + passing flatus.  Denies issues with urination and reports bleeding is "small."  Patient is Breastfeeding and reports going well.  Desires IUD at 6 week PPV for postpartum contraception.  Pain is being appropriately managed with use of motrin.   Intact laceration Feeding:  [place] Contraceptive plan:  IUD 6WPPV BB: Circ NO  Objective: Vital signs in last 24 hours: Patient Vitals for the past 24 hrs:  BP Temp Temp src Pulse Resp SpO2 Height Weight  10/26/17 0340 116/76 97.9 F (36.6 C) Oral 82 17 98 % - -  10/25/17 2327 136/84 98.4 F (36.9 C) Oral 80 18 - - -  10/25/17 1945 136/84 98.8 F (37.1 C) Oral 88 17 97 % - -  10/25/17 1839 138/89 99 F (37.2 C) Oral 89 16 - - -  10/25/17 1800 (!) 140/91 - - 93 16 - - -  10/25/17 1745 (!) 147/79 - - 96 16 - - -  10/25/17 1730 133/71 - - 91 18 - - -  10/25/17 1715 (!) 140/92 - - 96 18 - - -  10/25/17 1700 137/81 - - 96 16 - - -  10/25/17 1648 132/79 - - 97 16 - - -  10/25/17 1630 (!) 140/122 - - (!) 115 16 - - -  10/25/17 1600 (!) 148/90 98.3 F (36.8 C) Oral 99 18 - - -  10/25/17 1530 134/81 - - 91 16 - - -  10/25/17 1500 136/86 - - 89 18 - - -  10/25/17 1430 125/69 - - 93 18 - - -  10/25/17 1400 129/67 - - 96 16 - - -  10/25/17 1330 (!) 140/95 98.9 F (37.2 C) Oral 93 18 - - -  10/25/17 1300 134/87 - - 90 16 - - -  10/25/17 1250 136/89 - - 84 16 - - -  10/25/17 1245 132/84 - - 89 - - - -  10/25/17 1240 131/90 - - 92 18 - - -  10/25/17 1235 136/88 - - 90 16 - - -  10/25/17 1230 133/87 - - 92 18 - - -  10/25/17 1225 127/85 - - 87 16 - - -  10/25/17 1220 135/81 - - 91 18 - - -  10/25/17 1214 122/80 - - 88 16 - - -  10/25/17 1159 (!) 149/93 - - 99 16 - - -  10/25/17 1100 (!) 147/95 - - (!)  103 16 - - -  10/25/17 1000 (!) 128/95 - - 83 16 - - -  10/25/17 0930 136/82 - - 92 18 - - -  10/25/17 0900 (!) 157/95 - - 93 18 - - -  10/25/17 0830 128/76 - - 95 18 - - -  10/25/17 0800 (!) 139/100 - - (!) 108 16 - - -  10/25/17 0730 (!) 146/95 98.7 F (37.1 C) Oral 94 18 - 5\' 3"  (1.6 m) 93.4 kg (206 lb)  10/25/17 0700 (!) 138/94 - - 90 16 - - -  10/25/17 0658 (!) 145/95 - - 96 16 - - -  10/25/17 0600 (!) 147/98 - - 96 16 - - -  10/25/17 0505 140/90 - - 87 18 - - -     Physical Exam:  General: alert, cooperative, appears stated age  and no distress Mood/Affect: Happy Lungs: clear to auscultation, no wheezes, rales or rhonchi, symmetric air entry.  Heart: normal rate, regular rhythm, normal S1, S2, no murmurs, rubs, clicks or gallops. Breast: breasts appear normal, no suspicious masses, no skin or nipple changes or axillary nodes. Abdomen:  + bowel sounds, soft, non-tender GU: perineum Intact, healing well. No signs of external hematomas.  Uterine Fundus: firm Lochia: appropriate Skin: Warm, Dry. DVT Evaluation: No evidence of DVT seen on physical exam. Negative Homan's sign. No cords or calf tenderness. No significant calf/ankle edema.  CBC Latest Ref Rng & Units 10/25/2017 10/24/2017 04/30/2017  WBC 4.0 - 10.5 K/uL 10.8(H) 9.4 9.3  Hemoglobin 12.0 - 15.0 g/dL 11.4(L) 11.0(L) 13.3  Hematocrit 36.0 - 46.0 % 33.9(L) 33.2(L) 36.9  Platelets 150 - 400 K/uL 349 369 312    Results for orders placed or performed during the hospital encounter of 10/24/17 (from the past 24 hour(s))  CBC     Status: Abnormal   Collection Time: 10/25/17 11:21 AM  Result Value Ref Range   WBC 10.8 (H) 4.0 - 10.5 K/uL   RBC 4.11 3.87 - 5.11 MIL/uL   Hemoglobin 11.4 (L) 12.0 - 15.0 g/dL   HCT 16.1 (L) 09.6 - 04.5 %   MCV 82.5 78.0 - 100.0 fL   MCH 27.7 26.0 - 34.0 pg   MCHC 33.6 30.0 - 36.0 g/dL   RDW 40.9 81.1 - 91.4 %   Platelets 349 150 - 400 K/uL     CBG (last 3)  No results for input(s):  GLUCAP in the last 72 hours.   I/O last 3 completed shifts: In: -  Out: 900 [Urine:600; Blood:300]   Assessment Postpartum Day # 2 : S/P NSVD due to IOL: PreE without severe features, breech. Pt stable. -2 involution. Breastfeeding. Hemodynamically stable.   Plan: Continue other mgmt as ordered VTE prophylactics: Early ambulated as tolerates.  Pain control: Motrin/Tylenol PRN PreE: Labs pending for this AM.  Education given regarding options for contraception, including IUD placement. At 6 weeks PPV Plan for discharge tomorrow, Breastfeeding and Lactation consult Dr. Mora Appl to be updated on patient status  Janicia Monterrosa NP-C, CNM 10/26/2017, 4:11 AM

## 2017-10-26 NOTE — Anesthesia Postprocedure Evaluation (Signed)
Anesthesia Post Note  Patient: Anna Green  Procedure(s) Performed: AN AD HOC LABOR EPIDURAL     Patient location during evaluation: Mother Baby Anesthesia Type: Epidural Level of consciousness: awake and alert Pain management: pain level controlled Vital Signs Assessment: post-procedure vital signs reviewed and stable Respiratory status: spontaneous breathing, nonlabored ventilation and respiratory function stable Cardiovascular status: stable Postop Assessment: no headache, no backache, epidural receding and no apparent nausea or vomiting Anesthetic complications: no    Last Vitals:  Vitals:   10/25/17 2327 10/26/17 0340  BP: 136/84 116/76  Pulse: 80 82  Resp: 18 17  Temp: 36.9 C 36.6 C  SpO2:  98%    Last Pain:  Vitals:   10/26/17 0521  TempSrc:   PainSc: 5    Pain Goal:                 Rica RecordsICKELTON,[name]

## 2017-10-26 NOTE — Lactation Note (Signed)
This note was copied from a baby's chart. Lactation Consultation Note Baby 10 hrs old. Mom requesting to formula feed only informing LC. Mom asked to hand pump incase she decides to pump and bottle feed.  Discussed engorgement, prevention and breast stimulation.  Mom BF her 1st child for 1 month and stopped because she didn't like it. BF her 2nd child for 15 months until she became pregnant with this baby.  Mom stated she wasn't sure what she was going to do, but for now she wants to formula feed. Encouraged to call for assistance or question WH/LC brochure given w/resources, support groups and LC services.  Patient Name: Anna Green NWGNF'AToday's Date: 10/26/2017 Reason for consult: Initial assessment   Maternal Data Formula Feeding for Exclusion: Yes Reason for exclusion: Mother's choice to formula feed on admision Has patient been taught Hand Expression?: Yes Does the patient have breastfeeding experience prior to this delivery?: Yes  Feeding    LATCH Score                   Interventions    Lactation Tools Discussed/Used     Consult Status Consult Status: PRN Date: 10/27/17    Charyl DancerCARVER, [name] 10/26/2017, 3:01 AM

## 2018-12-01 ENCOUNTER — Other Ambulatory Visit: Payer: Self-pay

## 2018-12-01 DIAGNOSIS — Z20822 Contact with and (suspected) exposure to covid-19: Secondary | ICD-10-CM

## 2018-12-02 LAB — NOVEL CORONAVIRUS, NAA: SARS-CoV-2, NAA: NOT DETECTED

## 2018-12-04 ENCOUNTER — Telehealth: Payer: Self-pay | Admitting: Family Medicine

## 2018-12-04 NOTE — Telephone Encounter (Signed)
Patient called in and got her covid test result

## 2019-01-30 ENCOUNTER — Other Ambulatory Visit: Payer: Self-pay

## 2019-01-30 DIAGNOSIS — Z20822 Contact with and (suspected) exposure to covid-19: Secondary | ICD-10-CM

## 2019-01-31 LAB — NOVEL CORONAVIRUS, NAA: SARS-CoV-2, NAA: DETECTED — AB

## 2019-02-01 ENCOUNTER — Telehealth: Payer: Self-pay | Admitting: Family Medicine

## 2019-02-01 NOTE — Telephone Encounter (Signed)
I did not order the coronavirus test that was taken on October 2 but as I noticed it was positive made attempt to call patient and make sure she was aware because I did not see documentation in the chart saying that she knew.  Patient did not pick up and voicemail was full, I was unable to leave a message.  The ordering provider should continue to try to notify this patient  Dr. Criss Rosales

## 2019-02-05 ENCOUNTER — Other Ambulatory Visit: Payer: Self-pay

## 2019-02-05 DIAGNOSIS — Z20822 Contact with and (suspected) exposure to covid-19: Secondary | ICD-10-CM

## 2019-02-06 ENCOUNTER — Telehealth: Payer: Self-pay

## 2019-02-06 LAB — NOVEL CORONAVIRUS, NAA: SARS-CoV-2, NAA: NOT DETECTED

## 2019-02-09 NOTE — Telephone Encounter (Signed)
Provide covid lab results to Pt.  Who voiced understanding.

## 2019-05-04 ENCOUNTER — Ambulatory Visit: Payer: Self-pay | Attending: Internal Medicine

## 2019-05-04 DIAGNOSIS — Z20822 Contact with and (suspected) exposure to covid-19: Secondary | ICD-10-CM

## 2019-05-04 DIAGNOSIS — U071 COVID-19: Secondary | ICD-10-CM | POA: Insufficient documentation

## 2019-05-05 LAB — NOVEL CORONAVIRUS, NAA: SARS-CoV-2, NAA: NOT DETECTED

## 2019-08-29 IMAGING — US US OB COMP LESS 14 WK
1 series · 15 of 23 positions shown · non-contrast
Comparison: No prior scans from this gestation.

CLINICAL DATA: 26-year-old pregnant female presents with vaginal
bleeding.

EDC by LMP: 11/02/2017, projecting to an expected gestational age of
13 weeks 3 days.
EXAM:
OBSTETRIC <14 WK ULTRASOUND
TECHNIQUE: Transabdominal ultrasound was performed for evaluation of the
gestation as well as the maternal uterus and adnexal regions.

[Series 1: us ob comp less 14 wk · 15 of 23 slices shown]
[im 1/23]
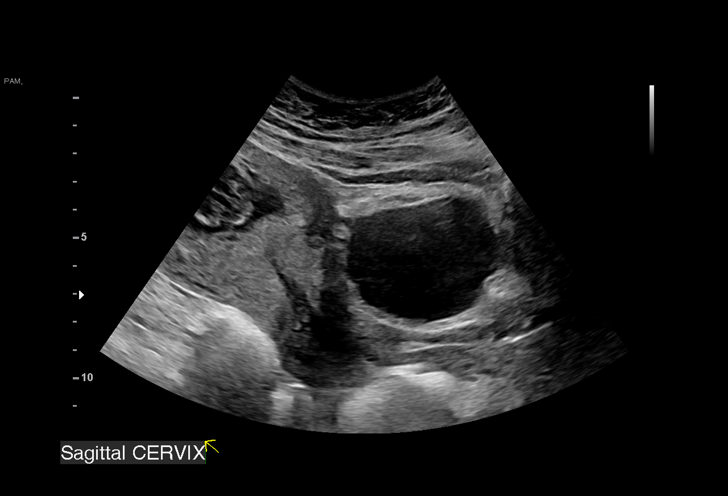
[im 3/23]
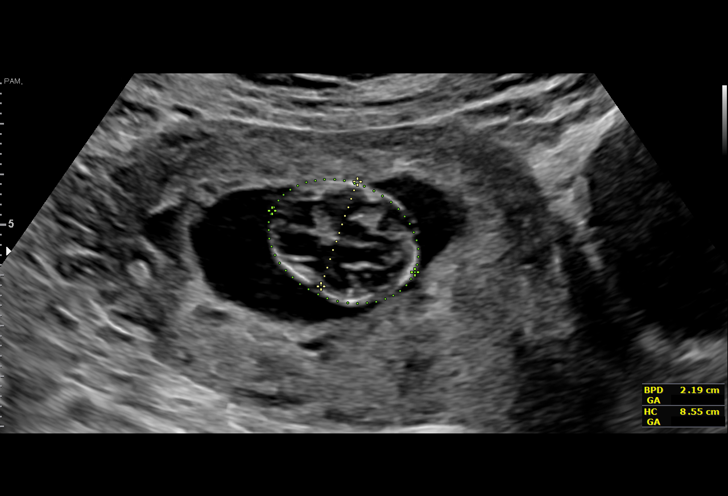
[im 4/23]
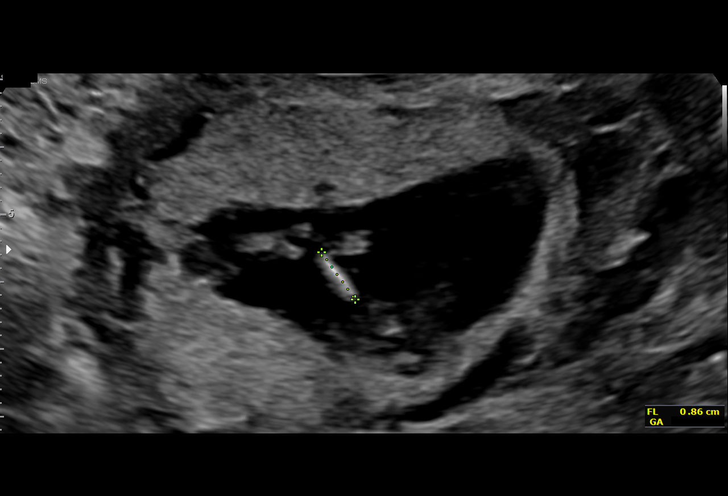
[im 6/23]
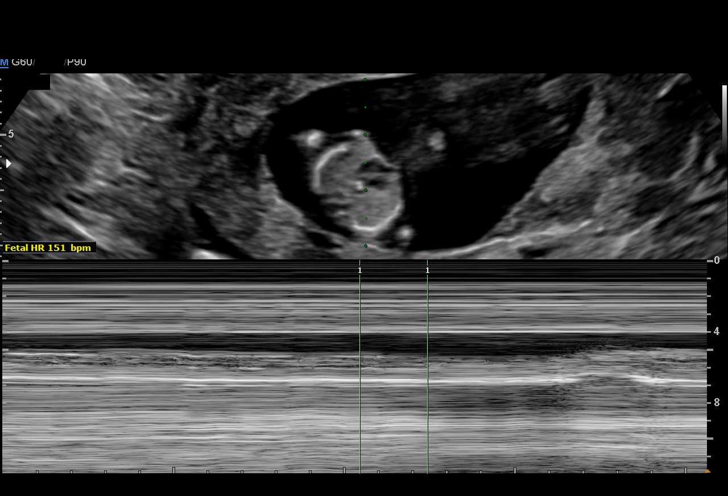
[im 7/23]
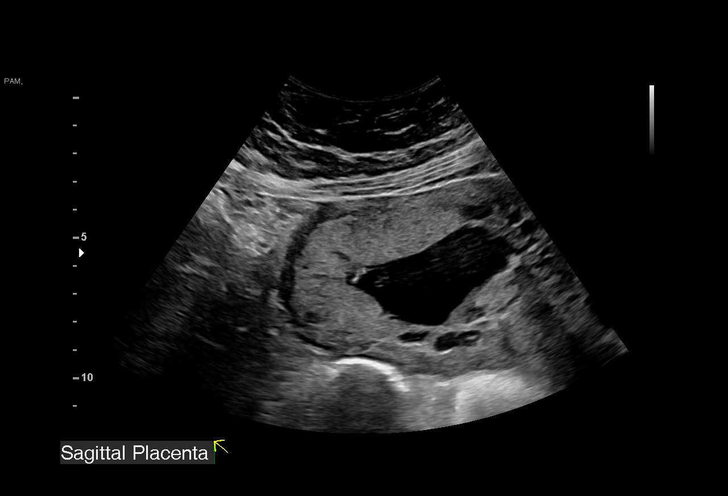
[im 9/23]
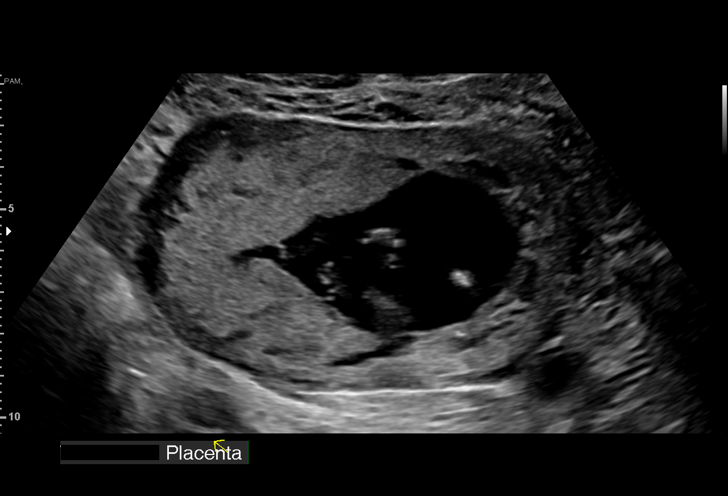
[im 10/23]
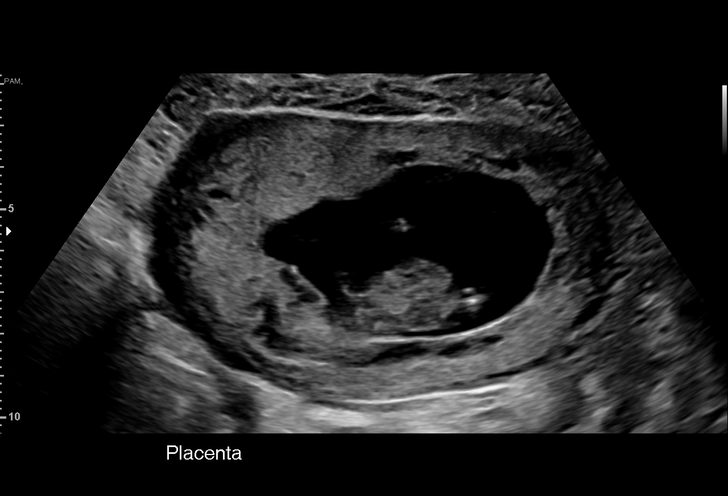
[im 12/23]
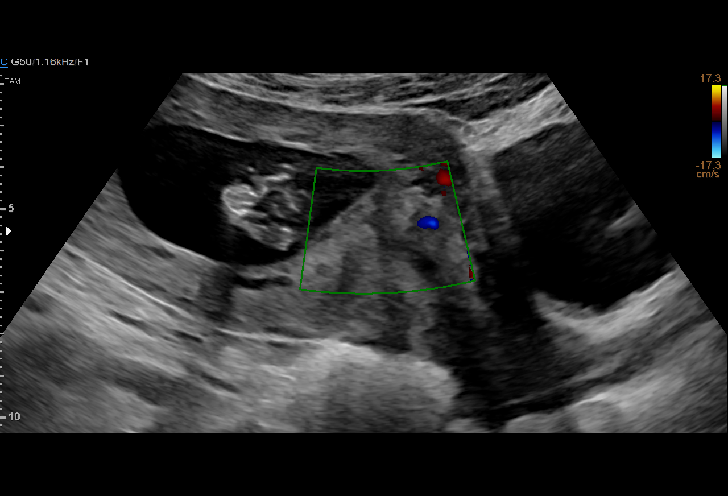
[im 14/23]
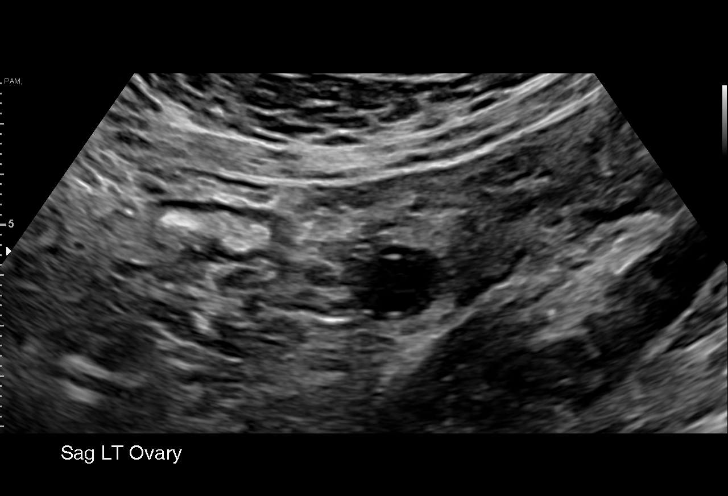
[im 15/23]
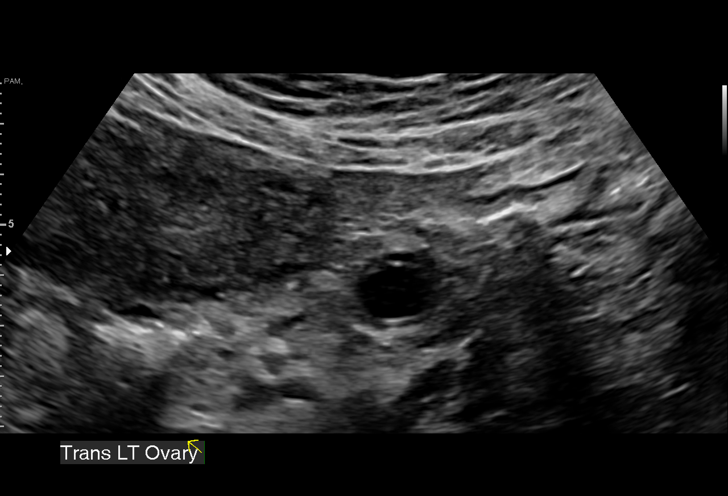
[im 17/23]
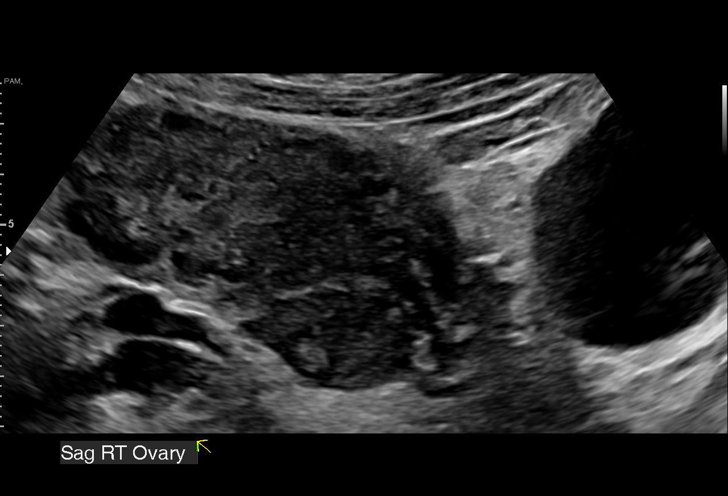
[im 18/23]
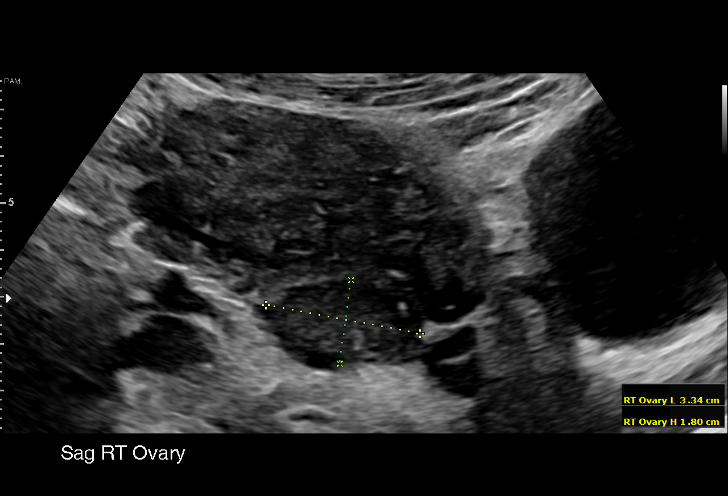
[im 20/23]
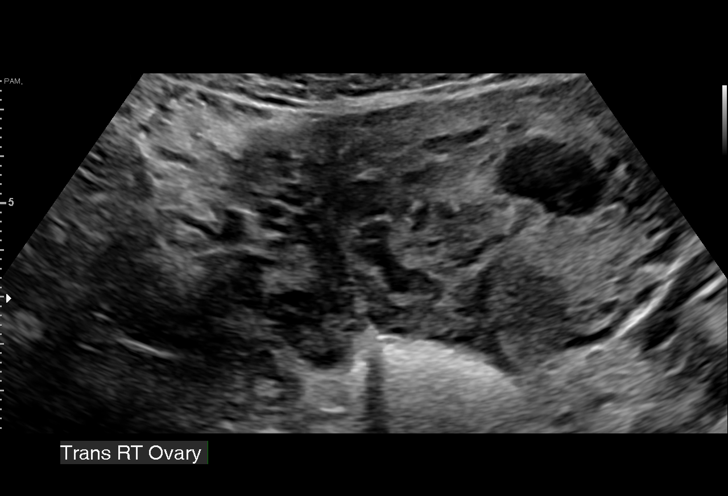
[im 21/23]
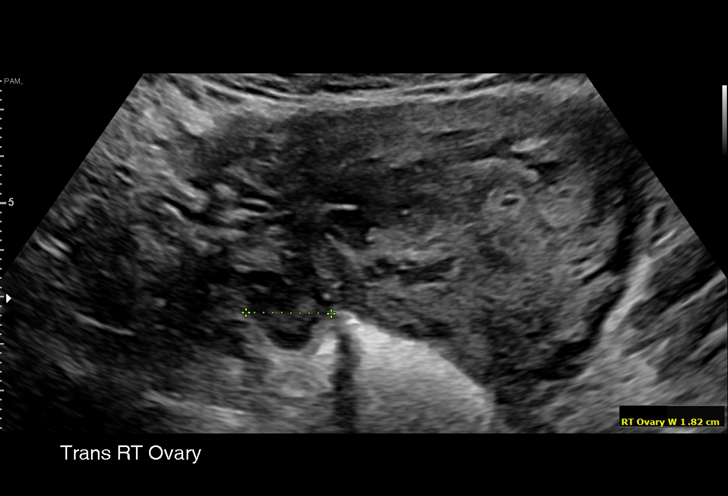
[im 23/23]
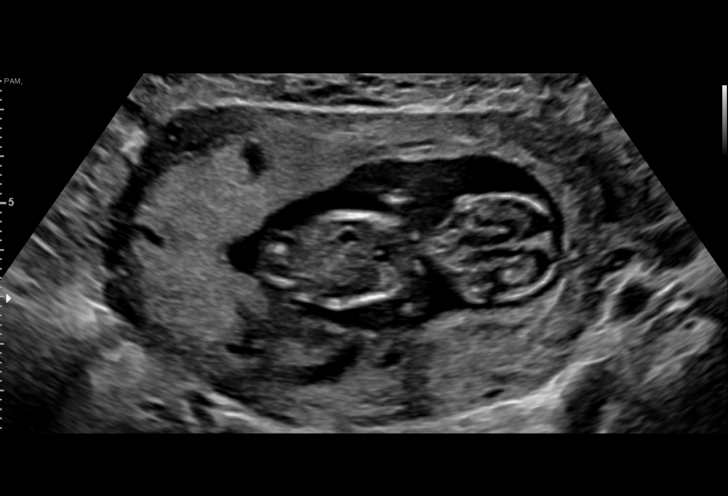

[15 of 23 positions shown; findings below may reference images not displayed]

FINDINGS: Intrauterine gestational sac: Single intrauterine gestational sac
appears normal in size, shape and position.

Yolk sac:  Not Visualized.

Fetus:  Visualized.

Fetal Cardiac Activity: Regular rate and rhythm.

Fetal Heart Rate: 151 bpm

CRL:   66.8  mm   12 w 6 d                  US EDC: 11/06/2017

Subchorionic hemorrhage:  None visualized.

Maternal uterus/adnexae: Left ovary measures 1.9 x 1.4 x 2.1 cm and
contains a corpus luteal cyst. Right ovary measures 3.3 x 1.8 x
cm. No abnormal ovarian or adnexal masses. No uterine fibroids. No
abnormal free fluid in the pelvis.
IMPRESSION: 1. Single living intrauterine gestation at 12 weeks 6 days by
crown-rump length, concordant with provided menstrual dating .
2. No acute first-trimester gestational abnormality. Normal fetal
cardiac activity.

## 2019-12-18 ENCOUNTER — Other Ambulatory Visit: Payer: Self-pay

## 2020-02-16 ENCOUNTER — Other Ambulatory Visit: Payer: Self-pay

## 2020-02-24 ENCOUNTER — Other Ambulatory Visit: Payer: Self-pay

## 2020-02-24 DIAGNOSIS — Z20822 Contact with and (suspected) exposure to covid-19: Secondary | ICD-10-CM

## 2020-02-25 LAB — NOVEL CORONAVIRUS, NAA: SARS-CoV-2, NAA: NOT DETECTED

## 2020-02-25 LAB — SARS-COV-2, NAA 2 DAY TAT

## 2020-04-09 ENCOUNTER — Other Ambulatory Visit: Payer: Self-pay

## 2020-04-09 DIAGNOSIS — Z20822 Contact with and (suspected) exposure to covid-19: Secondary | ICD-10-CM

## 2020-04-11 LAB — SARS-COV-2, NAA 2 DAY TAT

## 2020-04-11 LAB — NOVEL CORONAVIRUS, NAA: SARS-CoV-2, NAA: DETECTED — AB

## 2020-04-12 ENCOUNTER — Telehealth: Payer: Self-pay | Admitting: Nurse Practitioner

## 2020-04-12 ENCOUNTER — Encounter: Payer: Self-pay | Admitting: Nurse Practitioner

## 2020-04-12 DIAGNOSIS — U071 COVID-19: Secondary | ICD-10-CM

## 2020-04-12 NOTE — Telephone Encounter (Signed)
Called to Discuss with patient about Covid symptoms and the use of a monoclonal antibody infusion for those with mild to moderate Covid symptoms and at a high risk of hospitalization.     Pt is qualified for this infusion at the [facility] due to co-morbid conditions and/or a member of an at-risk group.     Unable to reach pt. Left message to return call. Sent mychart message.   Julliette Frentz, DNP, AGNP-C [phone] (Infusion Center Hotline)  

## 2021-10-03 ENCOUNTER — Encounter: Payer: Self-pay | Admitting: *Deleted

## 2023-02-12 DIAGNOSIS — Z113 Encounter for screening for infections with a predominantly sexual mode of transmission: Secondary | ICD-10-CM | POA: Diagnosis not present

## 2023-02-12 DIAGNOSIS — R300 Dysuria: Secondary | ICD-10-CM | POA: Diagnosis not present

## 2023-02-13 DIAGNOSIS — R300 Dysuria: Secondary | ICD-10-CM | POA: Diagnosis not present

## 2023-05-17 DIAGNOSIS — Z87440 Personal history of urinary (tract) infections: Secondary | ICD-10-CM | POA: Diagnosis not present

## 2023-05-17 DIAGNOSIS — Z3041 Encounter for surveillance of contraceptive pills: Secondary | ICD-10-CM | POA: Diagnosis not present

## 2023-05-17 DIAGNOSIS — Z01419 Encounter for gynecological examination (general) (routine) without abnormal findings: Secondary | ICD-10-CM | POA: Diagnosis not present

## 2023-05-17 DIAGNOSIS — Z124 Encounter for screening for malignant neoplasm of cervix: Secondary | ICD-10-CM | POA: Diagnosis not present

## 2023-10-24 ENCOUNTER — Encounter: Payer: Self-pay | Admitting: Emergency Medicine

## 2023-10-24 ENCOUNTER — Ambulatory Visit
Admission: EM | Admit: 2023-10-24 | Discharge: 2023-10-24 | Disposition: A | Discharge status: Home / Self Care | Payer: Self-pay

## 2023-10-24 DIAGNOSIS — M542 Cervicalgia: Secondary | ICD-10-CM

## 2023-10-24 MED ORDER — CYCLOBENZAPRINE HCL 10 MG PO TABS: 10.0000 mg | ORAL_TABLET | Freq: Two times a day (BID) | ORAL | 0 refills | Status: AC | PRN

## 2023-10-24 MED ORDER — PREDNISONE 20 MG PO TABS: 40.0000 mg | ORAL_TABLET | Freq: Every day | ORAL | 0 refills | Status: AC
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
